# Patient Record
Sex: Male | Born: 1970
Health system: Southern US, Community
[De-identification: ages and names within clinical notes are randomized; demographics above are authoritative.]

## PROBLEM LIST (undated history)

## (undated) DIAGNOSIS — I2510 Atherosclerotic heart disease of native coronary artery without angina pectoris: Secondary | ICD-10-CM

## (undated) DIAGNOSIS — K219 Gastro-esophageal reflux disease without esophagitis: Secondary | ICD-10-CM

## (undated) DIAGNOSIS — E7800 Pure hypercholesterolemia, unspecified: Secondary | ICD-10-CM

## (undated) DIAGNOSIS — G4730 Sleep apnea, unspecified: Secondary | ICD-10-CM

## (undated) DIAGNOSIS — R7303 Prediabetes: Secondary | ICD-10-CM

## (undated) HISTORY — DX: Morbid (severe) obesity due to excess calories: E66.01

## (undated) HISTORY — PX: COLONOSCOPY: SHX174

## (undated) HISTORY — DX: Pure hypercholesterolemia, unspecified: E78.00

## (undated) HISTORY — DX: Gastro-esophageal reflux disease without esophagitis: K21.9

## (undated) HISTORY — DX: Atherosclerotic heart disease of native coronary artery without angina pectoris: I25.10

## (undated) HISTORY — PX: UPPER GASTROINTESTINAL ENDOSCOPY: SHX188

## (undated) HISTORY — DX: Sleep apnea, unspecified: G47.30

---

## 2004-04-23 ENCOUNTER — Ambulatory Visit: Payer: Self-pay | Admitting: Internal Medicine

## 2004-10-14 ENCOUNTER — Ambulatory Visit: Payer: Self-pay | Admitting: Internal Medicine

## 2004-10-16 ENCOUNTER — Ambulatory Visit: Payer: Self-pay

## 2004-11-01 ENCOUNTER — Ambulatory Visit (HOSPITAL_COMMUNITY): Admission: RE | Admit: 2004-11-01 | Discharge: 2004-11-01 | Payer: Self-pay | Admitting: Internal Medicine

## 2004-11-19 ENCOUNTER — Ambulatory Visit: Payer: Self-pay | Admitting: Cardiology

## 2008-06-19 ENCOUNTER — Ambulatory Visit: Payer: Self-pay | Admitting: Family Medicine

## 2008-06-19 DIAGNOSIS — N508 Other specified disorders of male genital organs: Secondary | ICD-10-CM

## 2008-06-21 ENCOUNTER — Encounter: Admission: RE | Admit: 2008-06-21 | Discharge: 2008-06-21 | Payer: Self-pay | Admitting: Family Medicine

## 2008-07-05 ENCOUNTER — Ambulatory Visit: Payer: Self-pay | Admitting: Family Medicine

## 2008-07-05 LAB — CONVERTED CEMR LAB
ALT: 76 units/L — ABNORMAL HIGH (ref 0–53)
AST: 43 units/L — ABNORMAL HIGH (ref 0–37)
Alkaline Phosphatase: 57 units/L (ref 39–117)
Basophils Relative: 0.8 % (ref 0.0–3.0)
Bilirubin, Direct: 0.1 mg/dL (ref 0.0–0.3)
Chloride: 107 meq/L (ref 96–112)
Cholesterol: 230 mg/dL — ABNORMAL HIGH (ref 0–200)
Creatinine, Ser: 1 mg/dL (ref 0.4–1.5)
Eosinophils Relative: 2.2 % (ref 0.0–5.0)
HCT: 45.5 % (ref 39.0–52.0)
Hemoglobin: 16.1 g/dL (ref 13.0–17.0)
Lymphs Abs: 1.9 10*3/uL (ref 0.7–4.0)
MCV: 89.5 fL (ref 78.0–100.0)
Monocytes Absolute: 0.7 10*3/uL (ref 0.1–1.0)
Monocytes Relative: 9.3 % (ref 3.0–12.0)
Neutro Abs: 4.5 10*3/uL (ref 1.4–7.7)
Neutrophils Relative %: 62 % (ref 43.0–77.0)
Platelets: 254 10*3/uL (ref 150.0–400.0)
Potassium: 4.4 meq/L (ref 3.5–5.1)
RBC: 5.08 M/uL (ref 4.22–5.81)
RDW: 12 % (ref 11.5–14.6)
Sodium: 143 meq/L (ref 135–145)
Total CHOL/HDL Ratio: 6
WBC: 7.4 10*3/uL (ref 4.5–10.5)

## 2008-07-20 ENCOUNTER — Ambulatory Visit: Payer: Self-pay | Admitting: Family Medicine

## 2008-07-20 DIAGNOSIS — E785 Hyperlipidemia, unspecified: Secondary | ICD-10-CM | POA: Insufficient documentation

## 2008-09-13 ENCOUNTER — Ambulatory Visit: Payer: Self-pay | Admitting: Family Medicine

## 2008-09-13 LAB — CONVERTED CEMR LAB
Cholesterol: 196 mg/dL (ref 0–200)
HDL: 38 mg/dL — ABNORMAL LOW (ref >39.00)
Total Bilirubin: 0.8 mg/dL (ref 0.3–1.2)
Total CHOL/HDL Ratio: 5
Total Protein: 7.3 g/dL (ref 6.0–8.3)

## 2008-09-20 ENCOUNTER — Ambulatory Visit: Payer: Self-pay | Admitting: Family Medicine

## 2008-12-18 ENCOUNTER — Ambulatory Visit: Payer: Self-pay | Admitting: Family Medicine

## 2008-12-18 DIAGNOSIS — R030 Elevated blood-pressure reading, without diagnosis of hypertension: Secondary | ICD-10-CM

## 2009-01-25 ENCOUNTER — Ambulatory Visit (HOSPITAL_BASED_OUTPATIENT_CLINIC_OR_DEPARTMENT_OTHER): Admission: RE | Admit: 2009-01-25 | Discharge: 2009-01-25 | Payer: Self-pay | Admitting: Urology

## 2009-01-25 ENCOUNTER — Encounter: Payer: Self-pay | Admitting: Urology

## 2009-02-22 ENCOUNTER — Telehealth: Payer: Self-pay | Admitting: Family Medicine

## 2010-02-17 ENCOUNTER — Emergency Department: Payer: Self-pay | Admitting: Emergency Medicine

## 2010-02-25 ENCOUNTER — Ambulatory Visit: Payer: Self-pay | Admitting: Family Medicine

## 2010-02-25 DIAGNOSIS — M549 Dorsalgia, unspecified: Secondary | ICD-10-CM

## 2010-04-07 HISTORY — PX: VASECTOMY: SHX75

## 2010-04-28 ENCOUNTER — Encounter: Payer: Self-pay | Admitting: Internal Medicine

## 2010-05-07 NOTE — Assessment & Plan Note (Signed)
Summary: severe back pain/cjr   Vital Signs:  Patient profile:   40 year old male Height:      73.5 inches Weight:      294 pounds BMI:     38.40 Temp:     98.3 degrees F oral BP sitting:   130 / 88  (left arm) Cuff size:   regular  Vitals Entered By: Kern Reap CMA Duncan Dull) (February 25, 2010 3:40 PM) CC: back pain   CC:  back pain.  History of Present Illness: Brandon Bailey is a 2-year-old male, married, nonsmoker, who comes in today for evaluation of low back pain.  For the past two, weeks he's had low back pain.  10 days ago, became worse, and he went to the emergency room.  They x-rayed his back, which is of course, normal.  He was given prednisone, Vicodin and Flexeril, and comes back and states his back is not any better.  He describes pain is constant, sharp, and 8 on a scale of one to 10.  He points to his right and left lumbar area as a source of his pain.  It does not radiate down his legs.  He has no bowel or bladder dysfunction.  Review of systems otherwise negative.  No previous history of back pain  Allergies: No Known Drug Allergies  Past History:  Past medical, surgical, family and social histories (including risk factors) reviewed for relevance to current acute and chronic problems.  Family History: Reviewed history from 06/19/2008 and no changes required. father died of melanoma mother in good health.  No brothers or sisters  Social History: Reviewed history from 06/19/2008 and no changes required. Occupation: Married Never Smoked Alcohol use-no Drug use-no  Review of Systems      See HPI  Physical Exam  General:  Well-developed,well-nourished,in no acute distress; alert,appropriate and cooperative throughout examination Msk:  extremely tight hamstrings, left and especially on the right. Pulses:  R and L carotid,radial,femoral,dorsalis pedis and posterior tibial pulses are full and equal bilaterally Extremities:  No clubbing, cyanosis, edema, or  deformity noted with normal full range of motion of all joints.   Neurologic:  No cranial nerve deficits noted. Station and gait are normal. Plantar reflexes are down-going bilaterally. DTRs are symmetrical throughout. Sensory, motor and coordinative functions appear intact.   Problems:  Medical Problems Added: 1)  Dx of Back Pain  (ICD-724.5)  Impression & Recommendations:  Problem # 1:  BACK PAIN (ICD-724.5) Assessment New  His updated medication list for this problem includes:    Flexeril 10 Mg Tabs (Cyclobenzaprine hcl) .Marland Kitchen... 1 tab @ bedtime    Vicodin Es 7.5-750 Mg Tabs (Hydrocodone-acetaminophen) .Marland Kitchen... 1 tab @ bedtime  Orders: Physical Therapy Referral (PT)  Complete Medication List: 1)  Simvastatin 40 Mg Tabs (Simvastatin) .Marland Kitchen.. 1 tab @ bedtime 2)  Flexeril 10 Mg Tabs (Cyclobenzaprine hcl) .Marland Kitchen.. 1 tab @ bedtime 3)  Vicodin Es 7.5-750 Mg Tabs (Hydrocodone-acetaminophen) .Marland Kitchen.. 1 tab @ bedtime  Patient Instructions: 1)  begin Motrin 600 mg twice daily with food, and one half, Flexeril, and Vicodin at bedtime. 2)  We will set up physical therapy to begin ASAP. 3)  Return in two weeks for follow-up Prescriptions: VICODIN ES 7.5-750 MG TABS (HYDROCODONE-ACETAMINOPHEN) 1 tab @ bedtime  #30 x 1   Entered and Authorized by:   Roderick Pee MD   Signed by:   Roderick Pee MD on 02/25/2010   Method used:   Print then Give to Patient  RxID:   [id number] FLEXERIL 10 MG TABS (CYCLOBENZAPRINE HCL) 1 tab @ bedtime  #30 x 1   Entered and Authorized by:   Roderick Pee MD   Signed by:   Roderick Pee MD on 02/25/2010   Method used:   Print then Give to Patient   RxID:   [id number]    Orders Added: 1)  Est. Patient Level IV [31540] 2)  Physical Therapy Referral [PT]

## 2010-07-11 LAB — POCT HEMOGLOBIN-HEMACUE: Hemoglobin: 14.9 g/dL (ref 13.0–17.0)

## 2010-12-12 ENCOUNTER — Other Ambulatory Visit: Payer: Self-pay | Admitting: Urology

## 2010-12-12 ENCOUNTER — Ambulatory Visit (HOSPITAL_BASED_OUTPATIENT_CLINIC_OR_DEPARTMENT_OTHER)
Admission: RE | Admit: 2010-12-12 | Discharge: 2010-12-12 | Disposition: A | Discharge status: Home / Self Care | Payer: 59 | Source: Ambulatory Visit | Attending: Urology | Admitting: Urology

## 2010-12-12 DIAGNOSIS — Z01812 Encounter for preprocedural laboratory examination: Secondary | ICD-10-CM | POA: Insufficient documentation

## 2010-12-12 DIAGNOSIS — N4340 Spermatocele of epididymis, unspecified: Secondary | ICD-10-CM | POA: Insufficient documentation

## 2010-12-12 LAB — POCT HEMOGLOBIN-HEMACUE: Hemoglobin: 14.8 g/dL (ref 13.0–17.0)

## 2012-04-21 ENCOUNTER — Encounter: Payer: Self-pay | Admitting: Family Medicine

## 2012-04-21 ENCOUNTER — Ambulatory Visit (INDEPENDENT_AMBULATORY_CARE_PROVIDER_SITE_OTHER): Payer: 59 | Admitting: Family Medicine

## 2012-04-21 VITALS — BP 124/80 | Temp 98.2°F | Ht 74.5 in | Wt 323.0 lb

## 2012-04-21 DIAGNOSIS — E785 Hyperlipidemia, unspecified: Secondary | ICD-10-CM

## 2012-04-21 DIAGNOSIS — E669 Obesity, unspecified: Secondary | ICD-10-CM

## 2012-04-21 LAB — CBC WITH DIFFERENTIAL/PLATELET
Basophils Absolute: 0.1 10*3/uL (ref 0.0–0.1)
Basophils Relative: 0.7 % (ref 0.0–3.0)
Eosinophils Absolute: 0.2 10*3/uL (ref 0.0–0.7)
HCT: 45.6 % (ref 39.0–52.0)
Hemoglobin: 15.4 g/dL (ref 13.0–17.0)
Lymphocytes Relative: 26.9 % (ref 12.0–46.0)
MCHC: 33.8 g/dL (ref 30.0–36.0)
MCV: 87.8 fl (ref 78.0–100.0)
Monocytes Absolute: 0.7 10*3/uL (ref 0.1–1.0)
Monocytes Relative: 8.4 % (ref 3.0–12.0)
Platelets: 275 10*3/uL (ref 150.0–400.0)
WBC: 8.6 10*3/uL (ref 4.5–10.5)

## 2012-04-21 LAB — HEPATIC FUNCTION PANEL
ALT: 64 U/L — ABNORMAL HIGH (ref 0–53)
AST: 41 U/L — ABNORMAL HIGH (ref 0–37)
Alkaline Phosphatase: 61 U/L (ref 39–117)
Bilirubin, Direct: 0.1 mg/dL (ref 0.0–0.3)
Total Bilirubin: 0.9 mg/dL (ref 0.3–1.2)

## 2012-04-21 LAB — BASIC METABOLIC PANEL
BUN: 15 mg/dL (ref 6–23)
Calcium: 9 mg/dL (ref 8.4–10.5)
Chloride: 104 mEq/L (ref 96–112)
GFR: 83.86 mL/min (ref >60.00)
Sodium: 137 mEq/L (ref 135–145)

## 2012-04-21 LAB — POCT URINALYSIS DIPSTICK
Blood, UA: NEGATIVE
Glucose, UA: NEGATIVE
Nitrite, UA: NEGATIVE
Spec Grav, UA: >=1.03

## 2012-04-21 LAB — LDL CHOLESTEROL, DIRECT: Direct LDL: 151.2 mg/dL

## 2012-04-21 LAB — LIPID PANEL
Total CHOL/HDL Ratio: 6
Triglycerides: 169 mg/dL — ABNORMAL HIGH (ref 0.0–149.0)

## 2012-04-21 LAB — HEMOGLOBIN A1C: Hgb A1c MFr Bld: 6 % (ref 4.6–6.5)

## 2012-04-21 NOTE — Progress Notes (Signed)
  Subjective:    Patient ID: Brandon Bailey, male    DOB: 1970-11-20, 42 y.o.   MRN: [id number]  HPI Mayer is a 42 year old married male nonsmoker who comes in today for general physical examination concerned about his weight  His weight is 323 pounds. His wife says he snores and stops previous night and she's concerned he might have sleep apnea. He takes the medication on a daily basis for any chronic health problems  Tetanus booster 2010.  His wife is not overweight he has 2 children ages 13 and 34. Last summer they went to an amusement park but he was not able ride on some of the rise because he was overweight. His daughter has requested that he lose weight   Review of Systems  Constitutional: Negative.   HENT: Negative.   Eyes: Negative.   Respiratory: Negative.   Cardiovascular: Negative.   Gastrointestinal: Negative.   Genitourinary: Negative.   Musculoskeletal: Negative.   Skin: Negative.   Neurological: Negative.   Hematological: Negative.   Psychiatric/Behavioral: Negative.        Objective:   Physical Exam  Constitutional: He is oriented to person, place, and time. He appears well-developed and well-nourished.  HENT:  Head: Normocephalic and atraumatic.  Right Ear: External ear normal.  Left Ear: External ear normal.  Nose: Nose normal.  Mouth/Throat: Oropharynx is clear and moist.  Eyes: Conjunctivae normal and EOM are normal. Pupils are equal, round, and reactive to light.  Neck: Normal range of motion. Neck supple. No JVD present. No tracheal deviation present. No thyromegaly present.  Cardiovascular: Normal rate, regular rhythm, normal heart sounds and intact distal pulses.  Exam reveals no gallop and no friction rub.   No murmur heard. Pulmonary/Chest: Effort normal and breath sounds normal. No stridor. No respiratory distress. He has no wheezes. He has no rales. He exhibits no tenderness.  Abdominal: Soft. Bowel sounds are normal. He exhibits no distension  and no mass. There is no tenderness. There is no rebound and no guarding.  Genitourinary: Penis normal. No penile tenderness.  Musculoskeletal: Normal range of motion. He exhibits no edema and no tenderness.  Lymphadenopathy:    He has no cervical adenopathy.  Neurological: He is alert and oriented to person, place, and time. He has normal reflexes. No cranial nerve deficit. He exhibits normal muscle tone.  Skin: Skin is warm and dry. No rash noted. No erythema. No pallor.  Psychiatric: He has a normal mood and affect. His behavior is normal. Judgment and thought content normal.          Assessment & Plan:  Healthy male  Obesity begin diet exercise and weight loss program check labs  Question sleep apnea consult with Dr. Mellody Dance

## 2012-04-21 NOTE — Patient Instructions (Signed)
I will call you I gets her lab work back  We will set you up a consult with Dr. Mellody Dance for evaluation of possible sleep apnea  Begin a diet and exercise program  Followup with me in 6 months

## 2012-04-26 ENCOUNTER — Encounter: Payer: Self-pay | Admitting: Family Medicine

## 2012-04-27 ENCOUNTER — Telehealth: Payer: Self-pay | Admitting: Family Medicine

## 2012-04-27 NOTE — Telephone Encounter (Signed)
Pt states that his labs were not released to My Chart and is requesting to be called with results or have them mailed to him

## 2012-04-27 NOTE — Telephone Encounter (Signed)
Labs released to MyChart.

## 2012-05-05 ENCOUNTER — Institutional Professional Consult (permissible substitution): Payer: 59 | Admitting: Pulmonary Disease

## 2012-10-19 ENCOUNTER — Ambulatory Visit: Payer: 59 | Admitting: Family Medicine

## 2013-02-10 ENCOUNTER — Other Ambulatory Visit: Payer: Self-pay

## 2013-07-11 ENCOUNTER — Telehealth: Payer: Self-pay | Admitting: Family Medicine

## 2013-07-11 NOTE — Telephone Encounter (Signed)
Left message on machine for patient to return our call.  Patient will need an office visit for a prescription.

## 2013-07-11 NOTE — Telephone Encounter (Signed)
Patient Information:  Caller Name: Sharyn Lull  Phone: [phone]  Patient: Brandon Bailey, Brandon Bailey  Gender: Male  DOB: 1971/02/02  Age: 43 Years  PCP: Stevie Kern [facility])  Office Follow Up:  Does the office need to follow up with this patient?: Yes  Instructions For The Office: Pharmacy is [facility]- Wife is a Engineer, mining. She was diagnosed today with +strep culture.  She is requesting antibiotics for her husband who has fever and Exudate on tonsil.  PLEASE CONTACT.  RN Note:  Pharmacy is [facility]- Wife is a Engineer, mining. She was diagnosed today with +strep culture.  She is requesting antibiotics for her husband who has fever and Exudate on tonsil.  PLEASE CONTACT.  Symptoms  Reason For Call & Symptoms: Wife is calling in about her husband.  she states she was diagnosed with strep throat today. +strep Culture.   She is wanting a Rx for her husband because he has had a sore throat since Saturday 07/09/13.  Exudate noted on left tonsil.  Temperature today 100.8 (o).  Reviewed Health History In EMR: Yes  Reviewed Medications In EMR: Yes  Reviewed Allergies In EMR: Yes  Reviewed Surgeries / Procedures: Yes  Date of Onset of Symptoms: 07/09/2013  Treatments Tried: Ibuprofen  Treatments Tried Worked: No  Any Fever: Yes  Fever Taken: Oral  Fever Time Of Reading: 06:30:00  Fever Last Reading: 100.8  Guideline(s) Used:  Sore Throat  Disposition Per Guideline:   Strep Test Only Visit Today or Tomorrow  Reason For Disposition Reached:   Strep exposure within last 10 days  Advice Given:  For Relief of Sore Throat Pain:  Sip warm chicken broth or apple juice.  Suck on hard candy or a throat lozenge (over-the-counter).  Gargle warm salt water 3 times daily (1 teaspoon of salt in 8 oz or 240 ml of warm water).  Avoid cigarette smoke.  Pain Medicines:  For pain relief, you can take either acetaminophen, ibuprofen, or naproxen.  They are over-the-counter  (OTC) pain drugs. You can buy them at the drugstore.  Acetaminophen (e.g., Tylenol):  Regular Strength Tylenol: Take 650 mg (two 325 mg pills) by mouth every 4-6 hours as needed. Each Regular Strength Tylenol pill has 325 mg of acetaminophen.  Ibuprofen (e.g., Motrin, Advil):  Take 400 mg (two 200 mg pills) by mouth every 6 hours.  Liquids:  Adequate liquid intake is important to prevent dehydration. Drink 6-8 glasses of water per day.  Soft Diet:   Cold drinks and milk shakes are especially good (Reason: swollen tonsils can make some foods hard to swallow).  Contagiousness:   You can return to work or school after the fever is gone and you feel well enough to participate in normal activities. If your doctor determines that you have Strep throat, then you will need to take an antibiotic for 24 hours before you can return.  Call Back If:  Fever lasts longer than 3 days  You become worse.  Patient Refused Recommendation:  Patient Requests Prescription  Pharmacy is [facility]- Wife is a Engineer, mining. She was diagnosed today with +strep culture.  She is requesting antibiotics for her husband who has fever and Exudate on tonsil.  PLEASE CONTACT.

## 2013-07-12 NOTE — Telephone Encounter (Signed)
LMTCB

## 2013-08-13 ENCOUNTER — Encounter: Payer: Self-pay | Admitting: Family Medicine

## 2013-08-13 ENCOUNTER — Ambulatory Visit (INDEPENDENT_AMBULATORY_CARE_PROVIDER_SITE_OTHER): Payer: 59 | Admitting: Family Medicine

## 2013-08-13 VITALS — BP 128/80 | HR 99 | Temp 99.0°F | Wt 332.0 lb

## 2013-08-13 DIAGNOSIS — H6690 Otitis media, unspecified, unspecified ear: Secondary | ICD-10-CM

## 2013-08-13 DIAGNOSIS — H6692 Otitis media, unspecified, left ear: Secondary | ICD-10-CM

## 2013-08-13 MED ORDER — MOXIFLOXACIN HCL 400 MG PO TABS: 400.0000 mg | ORAL_TABLET | Freq: Every day | ORAL | Status: DC

## 2013-08-13 NOTE — Progress Notes (Signed)
   Subjective:    Patient ID: Brandon Bailey, male    DOB: [birth year], 43 y.o.   MRN: [id number]  Cough This is a new problem. The current episode started 1 to 4 weeks ago ( seen 5 days ago at urgent care.. given augmentin for left ear  otitis media). The problem has been gradually worsening. The problem occurs constantly. The cough is productive of purulent sputum. Associated symptoms include ear congestion and nasal congestion. Pertinent negatives include no ear pain, shortness of breath or wheezing. Associated symptoms comments: Pain in ear has improved, but still full. Risk factors: non smoker. Treatments tried: guafenesin codeine, augementin. The treatment provided significant relief. There is no history of asthma, COPD or environmental allergies.      Review of Systems  HENT: Negative for ear pain.   Respiratory: Positive for cough. Negative for shortness of breath and wheezing.   Allergic/Immunologic: Negative for environmental allergies.       Objective:   Physical Exam  Constitutional: Vital signs are normal. He appears well-developed and well-nourished.  Non-toxic appearance. He does not appear ill. No distress.  HENT:  Head: Normocephalic and atraumatic.  Right Ear: Hearing, tympanic membrane, external ear and ear canal normal. No tenderness. No foreign bodies. Tympanic membrane is not retracted and not bulging.  Left Ear: Hearing, external ear and ear canal normal. No tenderness. No foreign bodies. Tympanic membrane is injected, scarred, erythematous and retracted. Tympanic membrane is not bulging. A middle ear effusion is present.  Nose: Nose normal. No mucosal edema or rhinorrhea. Right sinus exhibits no maxillary sinus tenderness and no frontal sinus tenderness. Left sinus exhibits no maxillary sinus tenderness and no frontal sinus tenderness.  Mouth/Throat: Uvula is midline, oropharynx is clear and moist and mucous membranes are normal. Normal dentition. No dental caries. No  oropharyngeal exudate or tonsillar abscesses.  Eyes: Conjunctivae, EOM and lids are normal. Pupils are equal, round, and reactive to light. Lids are everted and swept, no foreign bodies found.  Neck: Trachea normal, normal range of motion and phonation normal. Neck supple. Carotid bruit is not present. No mass and no thyromegaly present.  Cardiovascular: Normal rate, regular rhythm, S1 normal, S2 normal, normal heart sounds, intact distal pulses and normal pulses.  Exam reveals no gallop.   No murmur heard. Pulmonary/Chest: Effort normal and breath sounds normal. No respiratory distress. He has no wheezes. He has no rhonchi. He has no rales.  Abdominal: Soft. Normal appearance and bowel sounds are normal. There is no hepatosplenomegaly. There is no tenderness. There is no rebound, no guarding and no CVA tenderness. No hernia.  Neurological: He is alert. He has normal reflexes.  Skin: Skin is warm, dry and intact. No rash noted.  Psychiatric: He has a normal mood and affect. His speech is normal and behavior is normal. Judgment normal.          Assessment & Plan:

## 2013-08-13 NOTE — Progress Notes (Signed)
Pre visit review using our clinic review tool, if applicable. No additional management support is needed unless otherwise documented below in the visit note. 

## 2013-08-13 NOTE — Patient Instructions (Signed)
Stop augemntin. Change to avelox.  Mucinex DM during the day. Start nasal saline spray 3-4 times day.

## 2013-08-15 DIAGNOSIS — H6692 Otitis media, unspecified, left ear: Secondary | ICD-10-CM | POA: Insufficient documentation

## 2013-08-15 NOTE — Assessment & Plan Note (Signed)
D/C augmentin given minimal improvement. Will change to flouroquinolone.  Continue symptomatic care.

## 2014-06-07 ENCOUNTER — Other Ambulatory Visit (INDEPENDENT_AMBULATORY_CARE_PROVIDER_SITE_OTHER): Payer: 59

## 2014-06-07 DIAGNOSIS — Z0000 Encounter for general adult medical examination without abnormal findings: Secondary | ICD-10-CM

## 2014-06-07 LAB — CBC WITH DIFFERENTIAL/PLATELET
BASOS PCT: 0.4 % (ref 0.0–3.0)
Basophils Absolute: 0 10*3/uL (ref 0.0–0.1)
EOS ABS: 0.3 10*3/uL (ref 0.0–0.7)
EOS PCT: 2.9 % (ref 0.0–5.0)
HCT: 45.9 % (ref 39.0–52.0)
Hemoglobin: 15.9 g/dL (ref 13.0–17.0)
LYMPHS PCT: 23.4 % (ref 12.0–46.0)
Lymphs Abs: 2.1 10*3/uL (ref 0.7–4.0)
MCHC: 34.6 g/dL (ref 30.0–36.0)
MCV: 85.9 fl (ref 78.0–100.0)
Monocytes Absolute: 0.8 10*3/uL (ref 0.1–1.0)
Monocytes Relative: 8.7 % (ref 3.0–12.0)
NEUTROS PCT: 64.6 % (ref 43.0–77.0)
Neutro Abs: 5.9 10*3/uL (ref 1.4–7.7)
Platelets: 287 10*3/uL (ref 150.0–400.0)
RBC: 5.34 Mil/uL (ref 4.22–5.81)
RDW: 13.4 % (ref 11.5–15.5)
WBC: 9.1 10*3/uL (ref 4.0–10.5)

## 2014-06-07 LAB — POCT URINALYSIS DIPSTICK
BILIRUBIN UA: NEGATIVE
Blood, UA: NEGATIVE
Glucose, UA: NEGATIVE
KETONES UA: NEGATIVE
LEUKOCYTES UA: NEGATIVE
Nitrite, UA: NEGATIVE
PH UA: 5.5
PROTEIN UA: NEGATIVE
SPEC GRAV UA: 1.02
Urobilinogen, UA: 0.2

## 2014-06-07 LAB — LIPID PANEL
CHOLESTEROL: 230 mg/dL — AB (ref 0–200)
HDL: 40.1 mg/dL (ref >39.00)
LDL CALC: 155 mg/dL — AB (ref 0–99)
NonHDL: 189.9
TRIGLYCERIDES: 173 mg/dL — AB (ref 0.0–149.0)
Total CHOL/HDL Ratio: 6
VLDL: 34.6 mg/dL (ref 0.0–40.0)

## 2014-06-07 LAB — BASIC METABOLIC PANEL
BUN: 16 mg/dL (ref 6–23)
CHLORIDE: 102 meq/L (ref 96–112)
CO2: 26 mEq/L (ref 19–32)
Calcium: 9.3 mg/dL (ref 8.4–10.5)
Creatinine, Ser: 1.04 mg/dL (ref 0.40–1.50)
GFR: 82.12 mL/min (ref >60.00)
GLUCOSE: 114 mg/dL — AB (ref 70–99)
Potassium: 3.7 mEq/L (ref 3.5–5.1)
Sodium: 136 mEq/L (ref 135–145)

## 2014-06-07 LAB — HEPATIC FUNCTION PANEL
ALBUMIN: 4.2 g/dL (ref 3.5–5.2)
ALT: 47 U/L (ref 0–53)
AST: 33 U/L (ref 0–37)
Alkaline Phosphatase: 75 U/L (ref 39–117)
Bilirubin, Direct: 0.1 mg/dL (ref 0.0–0.3)
TOTAL PROTEIN: 7.9 g/dL (ref 6.0–8.3)
Total Bilirubin: 0.7 mg/dL (ref 0.2–1.2)

## 2014-06-07 LAB — TSH: TSH: 2.18 u[IU]/mL (ref 0.35–4.50)

## 2014-06-14 ENCOUNTER — Telehealth: Payer: Self-pay | Admitting: Pulmonary Disease

## 2014-06-14 ENCOUNTER — Encounter: Payer: Self-pay | Admitting: *Deleted

## 2014-06-14 ENCOUNTER — Ambulatory Visit (INDEPENDENT_AMBULATORY_CARE_PROVIDER_SITE_OTHER): Payer: 59 | Admitting: Family Medicine

## 2014-06-14 ENCOUNTER — Encounter: Payer: Self-pay | Admitting: Family Medicine

## 2014-06-14 VITALS — BP 120/84 | Temp 98.4°F | Ht 74.25 in | Wt 353.0 lb

## 2014-06-14 DIAGNOSIS — Z0000 Encounter for general adult medical examination without abnormal findings: Secondary | ICD-10-CM

## 2014-06-14 DIAGNOSIS — E669 Obesity, unspecified: Secondary | ICD-10-CM

## 2014-06-14 NOTE — Progress Notes (Signed)
Pre visit review using our clinic review tool, if applicable. No additional management support is needed unless otherwise documented below in the visit note. 

## 2014-06-14 NOTE — Telephone Encounter (Signed)
Spoke with Brandon Bailey at Dr. Honor Junes office, advised her that there are only 4 sleep docs in the office and the end of April is the earliest appt available.  Nothing further needed at this time.

## 2014-06-14 NOTE — Patient Instructions (Addendum)
We will set up a consult with Dr. Bonner Puna....... in pulmonary for evaluation of the sleep apnea  We will set up a nutrition consult  You will follow-up with Dr. Hassell Done  No tea no sodas no sugar  Begin walking 15 minutes daily in 1 week increase it to 30 minutes daily

## 2014-06-14 NOTE — Progress Notes (Signed)
   Subjective:    Patient ID: Brandon Bailey, male    DOB: 06/13/1970, 44 y.o.   MRN: [id number]  HPI Valerian is a 44 year old married male nonsmoker who comes in today for general physical examination because of morbid obesity.  His weight is up to 353 pounds. We saw him in 2004. We prescribed a diet and exercise program consult with Lanny Hurst clients in pulmonary because of sleep apnea. He never came back for follow-up. He now is considering it. He also was considering bariatric surgery by Dr. Hassell Done  Vaccinations up-to-date  Father had glaucoma recommended annual eye exam  He's tried numerous weight loss programs including Weight Watchers etc. etc. with no long-term weight loss achieved. Therefore I think he would be an excellent candidate for the bariatric program with Dr. Hassell Done. He's now highly motivated. He has glucose intolerance and elevated cholesterol blood pressure is normal for now. He also has sleep apnea. His wife tells me he has to get up 45 times a night the P he drinks copious amounts of Tea    Review of Systems  Constitutional: Negative.   HENT: Negative.   Eyes: Negative.   Respiratory: Negative.   Cardiovascular: Negative.   Gastrointestinal: Negative.   Endocrine: Negative.   Genitourinary: Negative.   Musculoskeletal: Negative.   Skin: Negative.   Allergic/Immunologic: Negative.   Neurological: Negative.   Hematological: Negative.   Psychiatric/Behavioral: Negative.        Objective:   Physical Exam  Constitutional: He is oriented to person, place, and time. He appears well-developed and well-nourished.  HENT:  Head: Normocephalic and atraumatic.  Right Ear: External ear normal.  Left Ear: External ear normal.  Nose: Nose normal.  Mouth/Throat: Oropharynx is clear and moist.  Eyes: Conjunctivae and EOM are normal. Pupils are equal, round, and reactive to light.  Neck: Normal range of motion. Neck supple. No JVD present. No tracheal deviation present. No  thyromegaly present.  Cardiovascular: Normal rate, regular rhythm, normal heart sounds and intact distal pulses.  Exam reveals no gallop and no friction rub.   No murmur heard. Pulmonary/Chest: Effort normal and breath sounds normal. No stridor. No respiratory distress. He has no wheezes. He has no rales. He exhibits no tenderness.  Abdominal: Soft. Bowel sounds are normal. He exhibits no distension and no mass. There is no tenderness. There is no rebound and no guarding.  Genitourinary:  No history of any genitorectal problems no family history of prostate cancer therefore screening at age 2  Musculoskeletal: Normal range of motion. He exhibits no edema or tenderness.  Lymphadenopathy:    He has no cervical adenopathy.  Neurological: He is alert and oriented to person, place, and time. He has normal reflexes. No cranial nerve deficit. He exhibits normal muscle tone.  Skin: Skin is warm and dry. No rash noted. No erythema. No pallor.  Psychiatric: He has a normal mood and affect. His behavior is normal. Judgment and thought content normal.  Nursing note and vitals reviewed.         Assessment & Plan:  Morbid obesity...Marland KitchenMarland KitchenMarland Kitchen outlined program of diet exercise weight loss nutrition consult consult with Dr. Kaylyn Lim.....Marland Kitchen also with [facility] Hurst C because of the sleep apnea

## 2014-07-06 ENCOUNTER — Telehealth: Payer: Self-pay | Admitting: Family Medicine

## 2014-07-06 NOTE — Telephone Encounter (Signed)
Spoke with patient and he will fax papers to be filled out and re-faxed to [phone].

## 2014-07-06 NOTE — Telephone Encounter (Signed)
Pt would like a call back he has some paperwork that need to be filled out before April 6

## 2014-07-07 ENCOUNTER — Encounter: Payer: Self-pay | Admitting: *Deleted

## 2014-07-07 DIAGNOSIS — R7303 Prediabetes: Secondary | ICD-10-CM | POA: Insufficient documentation

## 2014-07-07 DIAGNOSIS — G4733 Obstructive sleep apnea (adult) (pediatric): Secondary | ICD-10-CM | POA: Insufficient documentation

## 2014-07-12 ENCOUNTER — Encounter: Payer: 59 | Attending: Family Medicine | Admitting: Dietician

## 2014-07-12 ENCOUNTER — Other Ambulatory Visit (INDEPENDENT_AMBULATORY_CARE_PROVIDER_SITE_OTHER): Payer: Self-pay

## 2014-07-12 VITALS — Ht 74.0 in | Wt 347.4 lb

## 2014-07-12 DIAGNOSIS — Z01818 Encounter for other preprocedural examination: Secondary | ICD-10-CM

## 2014-07-12 DIAGNOSIS — E669 Obesity, unspecified: Secondary | ICD-10-CM

## 2014-07-12 DIAGNOSIS — R7309 Other abnormal glucose: Secondary | ICD-10-CM | POA: Insufficient documentation

## 2014-07-12 DIAGNOSIS — Z6841 Body Mass Index (BMI) 40.0 and over, adult: Secondary | ICD-10-CM | POA: Insufficient documentation

## 2014-07-12 DIAGNOSIS — E785 Hyperlipidemia, unspecified: Secondary | ICD-10-CM | POA: Diagnosis not present

## 2014-07-12 DIAGNOSIS — Z713 Dietary counseling and surveillance: Secondary | ICD-10-CM | POA: Diagnosis not present

## 2014-07-12 DIAGNOSIS — M549 Dorsalgia, unspecified: Secondary | ICD-10-CM | POA: Diagnosis not present

## 2014-07-12 DIAGNOSIS — G4730 Sleep apnea, unspecified: Secondary | ICD-10-CM | POA: Insufficient documentation

## 2014-07-12 NOTE — Progress Notes (Signed)
  Pre-Op Assessment Visit:  Pre-Operative Gastric sleeve Surgery  Medical Nutrition Therapy:  Appt start time: 1100   End time:  1130.  Patient was seen on 07/12/14 for Pre-Operative Nutrition Assessment. Assessment and letter of approval faxed to [facility] Bariatric Surgery Program coordinator on 07/12/14.   Preferred Learning Style:   No preference indicated   Learning Readiness:   Ready  Handouts given during visit include:  Pre-Op Goals Bariatric Surgery Protein Shakes   During the appointment today the following Pre-Op Goals were reviewed with the patient: Maintain or lose weight as instructed by your surgeon Make healthy food choices Begin to limit portion sizes Limited concentrated sugars and fried foods Keep fat/sugar in the single digits per serving on   food labels Practice CHEWING your food  (aim for 30 chews per bite or until applesauce consistency) Practice not drinking 15 minutes before, during, and 30 minutes after each meal/snack Avoid all carbonated beverages  Avoid/limit caffeinated beverages  Avoid all sugar-sweetened beverages Consume 3 meals per day; eat every 3-5 hours Make a list of non-food related activities Aim for 64-100 ounces of FLUID daily  Aim for at least 60-80 grams of PROTEIN daily Look for a liquid protein source that contain ?15 g protein and ?5 g carbohydrate  (ex: shakes, drinks, shots)  Patient-Centered Goals: -Overall healthy lifestyle -More active with kids -Rides at [facility] with daughter  Scale of 1-10: confidence (9) / importance (10)  Demonstrated degree of understanding via:  Teach Back  Teaching Method Utilized:  Visual Auditory Hands on  Barriers to learning/adherence to lifestyle change: none  Patient to call the [facility] to enroll in Pre-Op and Post-Op Nutrition Education when surgery date is scheduled.

## 2014-08-04 ENCOUNTER — Encounter: Payer: Self-pay | Admitting: Pulmonary Disease

## 2014-08-04 ENCOUNTER — Ambulatory Visit (INDEPENDENT_AMBULATORY_CARE_PROVIDER_SITE_OTHER): Payer: 59 | Admitting: Pulmonary Disease

## 2014-08-04 DIAGNOSIS — G4733 Obstructive sleep apnea (adult) (pediatric): Secondary | ICD-10-CM | POA: Diagnosis not present

## 2014-08-04 NOTE — Patient Instructions (Signed)
Will set up for home sleep testing, and will call once the results are available. Work on weight loss  

## 2014-08-04 NOTE — Progress Notes (Signed)
Subjective:    Patient ID: Brandon Bailey, male    DOB: 1970/09/04, 44 y.o.   MRN: [id number]  HPI The patient is a 44 year old male who I've been asked to see for possible obstructive sleep apnea. He has been told by his wife that he is a loud snorer, and also has an abnormal breathing pattern during sleep. He has frequent awakenings at night, and is not rested in the mornings upon arising. He notes significant inappropriate daytime sleepiness while at work, and he can even fall asleep at his desk. He also dozes in the evenings watching television or movies, and can experience sleep pressure while driving longer distances. The patient states that his weight is up 25 pounds over the last 2 years, and his Epworth score is very abnormal at 14.   Sleep Questionnaire What time do you typically go to bed?( Between what hours) 9-10p 9-10p at 1400 on 08/04/14 by Inge Rise, CMA How long does it take you to fall asleep? less than 10 min less than 10 min at 1400 on 08/04/14 by Inge Rise, CMA How many times during the night do you wake up? 4 4 at 1400 on 08/04/14 by Inge Rise, [place] What time do you get out of bed to start your day? 0600 0600 at 1400 on 08/04/14 by Inge Rise, CMA Do you drive or operate heavy machinery in your occupation? No No at 1400 on 08/04/14 by Inge Rise, CMA How much has your weight changed (up or down) over the past two years? (In pounds) 25 lb (11.34 kg) 25 lb (11.34 kg) at 1400 on 08/04/14 by Inge Rise, CMA Have you ever had a sleep study before? No No at 1400 on 08/04/14 by Inge Rise, CMA Do you currently use CPAP? No No at 1400 on 08/04/14 by Inge Rise, CMA Do you wear oxygen at any time? No No at 1400 on 08/04/14 by Inge Rise, CMA   Review of Systems  Constitutional: Negative for fever and unexpected weight change.  HENT: Negative for congestion, dental problem, ear pain, nosebleeds, postnasal drip, rhinorrhea, sinus  pressure, sneezing, sore throat and trouble swallowing.   Eyes: Negative for redness and itching.  Respiratory: Positive for shortness of breath. Negative for cough, chest tightness and wheezing.   Cardiovascular: Positive for leg swelling. Negative for palpitations.  Gastrointestinal: Negative for nausea and vomiting.  Genitourinary: Negative for dysuria.  Musculoskeletal: Positive for arthralgias. Negative for joint swelling.  Skin: Negative for rash.  Neurological: Negative for headaches.  Hematological: Does not bruise/bleed easily.  Psychiatric/Behavioral: Negative for dysphoric mood. The patient is not nervous/anxious.        Objective:   Physical Exam Constitutional:  Obese male, no acute distress  HENT:  Nares patent without discharge, deviated septum to right with narrowing.   Oropharynx without exudate, palate and uvula are thick and elongated.   Eyes:  Perrla, eomi, no scleral icterus  Neck:  No JVD, no TMG  Cardiovascular:  Normal rate, regular rhythm, no rubs or gallops.  No murmurs        Intact distal pulses  Pulmonary :  Normal breath sounds, no stridor or respiratory distress   No rales, rhonchi, or wheezing  Abdominal:  Soft, nondistended, bowel sounds present.  No tenderness noted.   Musculoskeletal:  No lower extremity edema noted.  Lymph Nodes:  No cervical lymphadenopathy noted  Skin:  No cyanosis noted  Neurologic:  Alert, appropriate,  moves all 4 extremities without obvious deficit.         Assessment & Plan:

## 2014-08-04 NOTE — Assessment & Plan Note (Signed)
The patient's history is classic for significant sleep disordered breathing. I have had a long discussion with him about the pathophysiology of sleep apnea, including its impact to his quality of life and cardiovascular health. I think he needs a sleep study at this time for diagnosis, and he is agreeable to this approach. He is an excellent candidate for home sleep testing.

## 2014-08-09 ENCOUNTER — Ambulatory Visit (HOSPITAL_COMMUNITY)
Admission: RE | Admit: 2014-08-09 | Discharge: 2014-08-09 | Disposition: A | Discharge status: Home / Self Care | Payer: 59 | Source: Ambulatory Visit | Attending: Surgery | Admitting: Surgery

## 2014-08-09 ENCOUNTER — Other Ambulatory Visit (INDEPENDENT_AMBULATORY_CARE_PROVIDER_SITE_OTHER): Payer: Self-pay

## 2014-08-09 ENCOUNTER — Other Ambulatory Visit: Payer: Self-pay

## 2014-08-09 ENCOUNTER — Ambulatory Visit (HOSPITAL_COMMUNITY)
Admission: RE | Admit: 2014-08-09 | Discharge: 2014-08-09 | Disposition: A | Discharge status: Home / Self Care | Payer: 59 | Source: Ambulatory Visit | Attending: General Surgery | Admitting: General Surgery

## 2014-08-09 ENCOUNTER — Encounter (HOSPITAL_COMMUNITY)
Admission: RE | Disposition: A | Discharge status: Home / Self Care | Payer: Self-pay | Source: Ambulatory Visit | Attending: Surgery

## 2014-08-09 DIAGNOSIS — Z01818 Encounter for other preprocedural examination: Secondary | ICD-10-CM

## 2014-08-09 HISTORY — PX: BREATH TEK H PYLORI: SHX5422

## 2014-08-09 SURGERY — BREATH TEST, FOR HELICOBACTER PYLORI

## 2014-08-09 NOTE — Progress Notes (Signed)
   08/09/14 [place]  Referring MD Dr Johnathan Hausen  Time of Last PO Intake 1800  Baseline Breath At: 2951  Pranactin Given At: 8841  Post-Dose Breath At: 0805  Sample 1 2.0%  Sample 2 2.2&  Test Negative

## 2014-08-10 ENCOUNTER — Encounter (HOSPITAL_COMMUNITY): Payer: Self-pay | Admitting: Surgery

## 2014-08-16 ENCOUNTER — Encounter: Payer: 59 | Attending: Family Medicine | Admitting: Dietician

## 2014-08-16 VITALS — Ht 74.0 in | Wt 340.4 lb

## 2014-08-16 DIAGNOSIS — M549 Dorsalgia, unspecified: Secondary | ICD-10-CM | POA: Diagnosis not present

## 2014-08-16 DIAGNOSIS — E785 Hyperlipidemia, unspecified: Secondary | ICD-10-CM | POA: Diagnosis not present

## 2014-08-16 DIAGNOSIS — Z6841 Body Mass Index (BMI) 40.0 and over, adult: Secondary | ICD-10-CM | POA: Insufficient documentation

## 2014-08-16 DIAGNOSIS — G4730 Sleep apnea, unspecified: Secondary | ICD-10-CM | POA: Diagnosis not present

## 2014-08-16 DIAGNOSIS — R7309 Other abnormal glucose: Secondary | ICD-10-CM | POA: Diagnosis not present

## 2014-08-16 DIAGNOSIS — Z01818 Encounter for other preprocedural examination: Secondary | ICD-10-CM | POA: Diagnosis present

## 2014-08-16 DIAGNOSIS — Z713 Dietary counseling and surveillance: Secondary | ICD-10-CM | POA: Insufficient documentation

## 2014-08-16 DIAGNOSIS — E669 Obesity, unspecified: Secondary | ICD-10-CM

## 2014-08-16 NOTE — Progress Notes (Signed)
Supervised Weight Loss:  Appt start time: 0800 end time:  0815  SWL visit 3 of 7:  Primary concerns today: Brandon Bailey returns for supervised weight loss having lost 5 pounds in the last month. He is having trouble walking consistently. Tried a powder protein shake and likes the vanilla flavor with unsweetened almond milk. Trying to drink more water.   Goal: Work on chewing thoroughly, taking small bites, and eating slowly  Weight: 340.4 lbs BMI: 43.8  Patient-Centered Goals: -Overall healthy lifestyle -More active with kids -Rides at [facility] with daughter  Scale of 1-10: confidence (9) / importance (10)  MEDICATIONS: see list  DIETARY INTAKE:  24-hr recall:  B ( AM): 2 eggs and 2 pieces of [place] bacon  Snk ( AM) :protein shake  L ( PM): chicken or hamburger steak with steamed vegetables   Snk ( PM): apple with peanut butter D ( PM): steak and sweet potato, green vegetable, pinto beans  Snk ( PM):   Beverages: water  Recent physical activity: trying to walk 30 minutes a day (3x a week usually)   Estimated energy needs: 2000 calories  Progress Towards Goal(s):  In progress.   Nutritional Diagnosis:  [place]-3.3 Overweight/obesity related to past poor dietary habits and physical inactivity as evidenced by patient in SWL for pending bariatric surgery following dietary guidelines for continued weight loss.     Intervention:  Nutrition counseling provided.   Monitoring/Evaluation:  Dietary intake, exercise, and body weight in 4 week(s).

## 2014-09-11 ENCOUNTER — Telehealth: Payer: Self-pay | Admitting: Pulmonary Disease

## 2014-09-12 NOTE — Telephone Encounter (Signed)
Called and spoke to pt's wife, Sharyn Lull. Sharyn Lull stated they have not yet heard from [place] regarding pt's home sleep study. Sharyn Lull is requesting the study to be done as soon as possible as pt is now having longer periods of apnea during sleep.   PCC's please advise.

## 2014-09-13 ENCOUNTER — Encounter: Payer: 59 | Attending: Family Medicine | Admitting: Dietician

## 2014-09-13 VITALS — Ht 74.0 in | Wt 341.9 lb

## 2014-09-13 DIAGNOSIS — Z01818 Encounter for other preprocedural examination: Secondary | ICD-10-CM | POA: Diagnosis present

## 2014-09-13 DIAGNOSIS — Z713 Dietary counseling and surveillance: Secondary | ICD-10-CM | POA: Insufficient documentation

## 2014-09-13 DIAGNOSIS — E669 Obesity, unspecified: Secondary | ICD-10-CM

## 2014-09-13 DIAGNOSIS — M549 Dorsalgia, unspecified: Secondary | ICD-10-CM | POA: Insufficient documentation

## 2014-09-13 DIAGNOSIS — E785 Hyperlipidemia, unspecified: Secondary | ICD-10-CM | POA: Insufficient documentation

## 2014-09-13 DIAGNOSIS — Z6841 Body Mass Index (BMI) 40.0 and over, adult: Secondary | ICD-10-CM | POA: Insufficient documentation

## 2014-09-13 DIAGNOSIS — R7309 Other abnormal glucose: Secondary | ICD-10-CM | POA: Insufficient documentation

## 2014-09-13 DIAGNOSIS — G4730 Sleep apnea, unspecified: Secondary | ICD-10-CM | POA: Diagnosis not present

## 2014-09-13 NOTE — Progress Notes (Signed)
Supervised Weight Loss:  Appt start time: 0800 end time:  0815  SWL visit 4 of 7:  Primary concerns today: Brandon Bailey returns for supervised weight loss having gained 1.5 pounds in the last month. Was on vacation 2 weeks ago and tried to focus more on protein foods. Also got more walking in. He has been working on eating slowly, taking small bites, and eating slowly. Still working on drinking more water.  Goal:  -Work on chewing thoroughly, taking small bites, and eating slowly -Aim to take 3 walks per week  Weight: 341.9 lbs BMI: 44  Patient-Centered Goals: -Overall healthy lifestyle -More active with kids -Rides at [facility] with daughter  Scale of 1-10: confidence (9) / importance (10)  MEDICATIONS: see list  DIETARY INTAKE:  24-hr recall:  B ( AM): 2 eggs and 2 pieces of [place] bacon  Snk ( AM) :protein shake  L ( PM): chicken or hamburger steak with steamed vegetables   Snk ( PM): apple with peanut butter D ( PM): steak and sweet potato, green vegetable, pinto beans  Snk ( PM):   Beverages: water  Recent physical activity: trying to walk 30 minutes a day (3x a week usually)   Estimated energy needs: 2000 calories  Progress Towards Goal(s):  In progress.   Nutritional Diagnosis:  [place]-3.3 Overweight/obesity related to past poor dietary habits and physical inactivity as evidenced by patient in SWL for pending bariatric surgery following dietary guidelines for continued weight loss.     Intervention:  Nutrition counseling provided.   Monitoring/Evaluation:  Dietary intake, exercise, and body weight in 4 week(s).

## 2014-09-13 NOTE — Telephone Encounter (Signed)
[facility] - there is a future order in the system for HST that was entered on 08/04/14 and does not expire until 08/04/15.  Do you still need a new order?  Or can you go with this order?

## 2014-09-13 NOTE — Telephone Encounter (Signed)
No new order thanks we will take care of it asap Brandon Bailey

## 2014-09-13 NOTE — Telephone Encounter (Signed)
Called and spoke with pt's wife and pt is scheduled to p/u device tomorrow (Thurs. 09/14/14) between 2:30 - 4:30. Nothing else needed at this time.  No new order needed. Rhonda J Cobb

## 2014-09-13 NOTE — Telephone Encounter (Signed)
i have searched and i do not see an order for HST [facility] seen pt 08/04/14 but i don't see order please place one and we will do asap Brandon Bailey

## 2014-09-14 DIAGNOSIS — G4730 Sleep apnea, unspecified: Secondary | ICD-10-CM | POA: Diagnosis not present

## 2014-09-16 ENCOUNTER — Telehealth: Payer: Self-pay | Admitting: Pulmonary Disease

## 2014-09-16 DIAGNOSIS — G4730 Sleep apnea, unspecified: Secondary | ICD-10-CM

## 2014-09-16 NOTE — Telephone Encounter (Signed)
Mindy, let pt know that he has severe sleep apnea, and needs ov to review study and discuss treatment options. Thanks.

## 2014-09-18 NOTE — Telephone Encounter (Signed)
lmtcb x1 for pt. 

## 2014-09-18 NOTE — Telephone Encounter (Signed)
Called spoke with spouse. appt scheduled to see TP 09/29/14. Will make TP aware.

## 2014-09-18 NOTE — Telephone Encounter (Signed)
Dr. Halford Chessman has nothing available until august. Can this patient be worked in? Please advise thanks

## 2014-09-18 NOTE — Telephone Encounter (Signed)
Pt returning call.Brandon Bailey ° °

## 2014-09-18 NOTE — Telephone Encounter (Signed)
Can schedule ROV with Tammy Parrett if I don't have anything available, and then he can f/u with me after that in few months after starting therapy for sleep apnea.

## 2014-09-19 ENCOUNTER — Encounter: Payer: Self-pay | Admitting: Pulmonary Disease

## 2014-09-29 ENCOUNTER — Ambulatory Visit (INDEPENDENT_AMBULATORY_CARE_PROVIDER_SITE_OTHER): Payer: 59 | Admitting: Adult Health

## 2014-09-29 ENCOUNTER — Encounter: Payer: Self-pay | Admitting: Adult Health

## 2014-09-29 VITALS — BP 124/78 | HR 102 | Temp 98.1°F | Ht 74.0 in | Wt 350.0 lb

## 2014-09-29 DIAGNOSIS — E669 Obesity, unspecified: Secondary | ICD-10-CM | POA: Diagnosis not present

## 2014-09-29 DIAGNOSIS — G4733 Obstructive sleep apnea (adult) (pediatric): Secondary | ICD-10-CM

## 2014-09-29 NOTE — Assessment & Plan Note (Signed)
Encouraged on weight loss

## 2014-09-29 NOTE — Patient Instructions (Signed)
Begin CPAP At bedtime  .  Auto-titration 5-15 with full fask mask.  Download in 4 weeks . Marland Kitchen  Weight loss.  follow up Dr. Elsworth Soho  In 6 weeks .

## 2014-09-29 NOTE — Progress Notes (Signed)
Subjective:    Patient ID: Brandon Bailey, male    DOB: April 07, 1971, 44 y.o.   MRN: [id number]  HPI 44 year old male seen for sleep consult 08/04/2014 with Dr. Gwenette Greet.  Test Home sleep study> AHI 80 with oxygen desaturation as low as 53%  09/29/2014 follow-up test results Patient returns a copy by his wife who is a Designer, jewellery. Patient was seen 2 months ago for sleep consult for daytime sleepiness, snoring and witnessed sleep apneas . Patient underwent a home sleep study that showed severe sleep apnea with a AHI of 80, low O2 saturation of 53%. He had a combination of obstructive and central sleep apnea. We discussed treatment options with C Pap. He is aware agreement and would like to proceed. We discussed C Pap titration. However, the sleep lab is booked out a few weeks. We will proceed with a C Pap auto titration at home. We discussed weight loss. Patient has been evaluated by general surgery for consideration of weight loss surgery. He is planning on the gastric sleeve later on this year. Patient education was given on sleep apnea and potential for development of comorbidities. -Including difficult to control hypertension, atrial fibrillation, pulmonary hypertension, etc. Advised on not driving if sleepy. Wife is concerned, the patient has had lower extremity swelling. He has been seen by his primary care physician for this recommended on leg elevation, low-salt diet. We did discuss that consideration for cardiac consultation and echo as he does have intermittent dyspnea.     Review of Systems Constitutional:   No  weight loss, night sweats,  Fevers, chills, fatigue, or  lassitude.  HEENT:   No headaches,  Difficulty swallowing,  Tooth/dental problems, or  Sore throat,                No sneezing, itching, ear ache, nasal congestion, post nasal drip,   CV:  No chest pain,  Orthopnea, PND, swelling in lower extremities, anasarca, dizziness, palpitations, syncope.   GI  No  heartburn, indigestion, abdominal pain, nausea, vomiting, diarrhea, change in bowel habits, loss of appetite, bloody stools.   Resp:    No excess mucus, no productive cough,  No non-productive cough,  No coughing up of blood.  No change in color of mucus.  No wheezing.  No chest wall deformity  Skin: no rash or lesions.  GU: no dysuria, change in color of urine, no urgency or frequency.  No flank pain, no hematuria   MS:  No joint pain or swelling.  No decreased range of motion.  No back pain.  Psych:  No change in mood or affect. No depression or anxiety.  No memory loss.         Objective:   Physical Exam  GEN: A/Ox3; pleasant , NAD, morbidly obese  HEENT:  /AT,  EACs-clear, TMs-wnl, NOSE-clear, THROAT-clear, no lesions, no postnasal drip or exudate noted.   NECK:  Supple w/ fair ROM; no JVD; normal carotid impulses w/o bruits; no thyromegaly or nodules palpated; no lymphadenopathy.  RESP  Clear  P & A; w/o, wheezes/ rales/ or rhonchi.no accessory muscle use, no dullness to percussion  CARD:  RRR, no m/r/g  , tr  peripheral edema, pulses intact, no cyanosis or clubbing.  GI:   Soft & nt; nml bowel sounds; no organomegaly or masses detected.  Musco: Warm bil, no deformities or joint swelling noted.   Neuro: alert, no focal deficits noted.    Skin: Warm, no lesions or rashes  Assessment & Plan:

## 2014-09-29 NOTE — Assessment & Plan Note (Signed)
Severe sleep apnea with obstructive and central events AHI of 80 with O2 desaturations as low as 53%. Patient is in agreement to begin nocturnal C Pap. Advised on dangers of sleep apnea Advised to not drive his sleepy encouragedon weight loss At bedtime begin nocturnal C Pap Will set up for an auto titration with pressures 5-15 Full face mask Download in 4 weeks . Office visit with Dr. Elsworth Soho in 6 weeks

## 2014-10-02 NOTE — Progress Notes (Signed)
Since severe OSA with very high AHI, suggest CPAP ttiration study

## 2014-10-05 ENCOUNTER — Telehealth: Payer: Self-pay | Admitting: Adult Health

## 2014-10-05 NOTE — Telephone Encounter (Signed)
Pt seen by TP on 6.24.16 to discuss sleep study results - old [place] pt Pt is a new CPAP start TP recommends pt follow up with either RA or VS 6 weeks from appt date  Had already spoken with pt's spouse earlier this week and they are too far from the [facility] office Discussed with [name] about work-in appt with VS - can use any day at 11a, 2p or 3p  Looking at VS' schedule, best option would be 8/15 at the 11a or 2p slot. LMOM TCB x1 for pt to discuss appt date/times  Will route back to my inbox

## 2014-10-06 NOTE — Telephone Encounter (Signed)
LMTCB x1 Please schedule OV for pt on 11/20/14 with VS - double book at either 11a or 2p slot. Thanks.

## 2014-10-06 NOTE — Telephone Encounter (Signed)
Pt returned Jessica's call - ph # [phone]

## 2014-10-12 NOTE — Telephone Encounter (Signed)
lmomtcb x2 for pt 

## 2014-10-17 ENCOUNTER — Encounter: Payer: 59 | Attending: Family Medicine | Admitting: Dietician

## 2014-10-17 ENCOUNTER — Encounter: Payer: Self-pay | Admitting: Dietician

## 2014-10-17 VITALS — Ht 74.0 in | Wt 343.6 lb

## 2014-10-17 DIAGNOSIS — Z6841 Body Mass Index (BMI) 40.0 and over, adult: Secondary | ICD-10-CM | POA: Insufficient documentation

## 2014-10-17 DIAGNOSIS — Z713 Dietary counseling and surveillance: Secondary | ICD-10-CM | POA: Diagnosis not present

## 2014-10-17 DIAGNOSIS — R7309 Other abnormal glucose: Secondary | ICD-10-CM | POA: Diagnosis not present

## 2014-10-17 DIAGNOSIS — G4730 Sleep apnea, unspecified: Secondary | ICD-10-CM | POA: Diagnosis not present

## 2014-10-17 DIAGNOSIS — E669 Obesity, unspecified: Secondary | ICD-10-CM

## 2014-10-17 DIAGNOSIS — M549 Dorsalgia, unspecified: Secondary | ICD-10-CM | POA: Diagnosis not present

## 2014-10-17 DIAGNOSIS — Z01818 Encounter for other preprocedural examination: Secondary | ICD-10-CM | POA: Diagnosis not present

## 2014-10-17 DIAGNOSIS — E785 Hyperlipidemia, unspecified: Secondary | ICD-10-CM | POA: Diagnosis not present

## 2014-10-17 NOTE — Progress Notes (Signed)
Supervised Weight Loss:  Appt start time: 1130 end time:  2174  SWL visit 5 of 7:  Primary concerns today: Brandon Bailey returns for supervised weight loss having gained 1.5 pounds in the last month. He states he has been walking regularly in the evenings for 20-30 minutes. He is also working on chewing thoroughly. Thinking about not drinking while eating. He is feeling good about surgery. He recently found out he has sleep apnea and is feeling much more energized after wearing a cpap.  Goal:  -Work on chewing thoroughly, taking small bites, and eating slowly -Keep walking regularly -Find a shake that you like  Weight: 343.6 lbs BMI: 44.2  Patient-Centered Goals: -Overall healthy lifestyle -More active with kids -Rides at [facility] with daughter  Scale of 1-10: confidence (9) / importance (10)  MEDICATIONS: see list  DIETARY INTAKE:  24-hr recall:  B ( AM): 2 eggs and 2 pieces of [place] bacon  Snk ( AM) :protein shake  L ( PM): chicken or hamburger steak with steamed vegetables   Snk ( PM): apple with peanut butter D ( PM): steak and sweet potato, green vegetable, pinto beans  Snk ( PM):   Beverages: water  Recent physical activity: trying to walk 30 minutes a day (3x a week usually)   Estimated energy needs: 2000 calories  Progress Towards Goal(s):  In progress.   Nutritional Diagnosis:  [place]-3.3 Overweight/obesity related to past poor dietary habits and physical inactivity as evidenced by patient in SWL for pending bariatric surgery following dietary guidelines for continued weight loss.     Intervention:  Nutrition counseling provided.   Monitoring/Evaluation:  Dietary intake, exercise, and body weight in 4 week(s).

## 2014-10-17 NOTE — Patient Instructions (Signed)
Goal:  -Work on chewing thoroughly, taking small bites, and eating slowly -Keep walking regularly -Find a shake that you like

## 2014-11-16 ENCOUNTER — Encounter: Payer: 59 | Attending: Family Medicine | Admitting: Dietician

## 2014-11-16 ENCOUNTER — Encounter: Payer: Self-pay | Admitting: Dietician

## 2014-11-16 VITALS — Ht 74.0 in | Wt 344.1 lb

## 2014-11-16 DIAGNOSIS — M549 Dorsalgia, unspecified: Secondary | ICD-10-CM | POA: Insufficient documentation

## 2014-11-16 DIAGNOSIS — G4730 Sleep apnea, unspecified: Secondary | ICD-10-CM | POA: Insufficient documentation

## 2014-11-16 DIAGNOSIS — E785 Hyperlipidemia, unspecified: Secondary | ICD-10-CM | POA: Insufficient documentation

## 2014-11-16 DIAGNOSIS — R7309 Other abnormal glucose: Secondary | ICD-10-CM | POA: Insufficient documentation

## 2014-11-16 DIAGNOSIS — E669 Obesity, unspecified: Secondary | ICD-10-CM

## 2014-11-16 DIAGNOSIS — Z713 Dietary counseling and surveillance: Secondary | ICD-10-CM | POA: Diagnosis not present

## 2014-11-16 DIAGNOSIS — Z01818 Encounter for other preprocedural examination: Secondary | ICD-10-CM | POA: Insufficient documentation

## 2014-11-16 DIAGNOSIS — Z6841 Body Mass Index (BMI) 40.0 and over, adult: Secondary | ICD-10-CM | POA: Insufficient documentation

## 2014-11-16 NOTE — Progress Notes (Signed)
Supervised Weight Loss:  Appt start time: 800 end time:  815  SWL visit 6 of 7:  Primary concerns today:  Brandon Bailey returns having gained 1 pound. He is still walking regularly and working on drinking more water. He got a powdered protein that he mixes with unsweetened almond milk.   Goal:  -Work on chewing thoroughly, taking small bites, and eating slowly  Weight: 344.1 lbs BMI: 44.3  Patient-Centered Goals: -Overall healthy lifestyle -More active with kids -Rides at [facility] with daughter  Scale of 1-10: confidence (9) / importance (10)  MEDICATIONS: see list  DIETARY INTAKE:  24-hr recall:  B ( AM): 2 eggs and 2 pieces of [place] bacon  Snk ( AM) :protein shake  L ( PM): chicken or hamburger steak with steamed vegetables   Snk ( PM): apple with peanut butter D ( PM): steak and sweet potato, green vegetable, pinto beans  Snk ( PM):   Beverages: water  Recent physical activity: trying to walk 30 minutes a day (3x a week usually)   Estimated energy needs: 2000 calories  Progress Towards Goal(s):  In progress.   Nutritional Diagnosis:  [place]-3.3 Overweight/obesity related to past poor dietary habits and physical inactivity as evidenced by patient in SWL for pending bariatric surgery following dietary guidelines for continued weight loss.     Intervention:  Nutrition counseling provided.   Monitoring/Evaluation:  Dietary intake, exercise, and body weight in 4 week(s).

## 2014-12-15 ENCOUNTER — Encounter: Payer: 59 | Attending: Family Medicine | Admitting: Dietician

## 2014-12-15 ENCOUNTER — Encounter: Payer: Self-pay | Admitting: Dietician

## 2014-12-15 VITALS — Ht 74.0 in | Wt 346.9 lb

## 2014-12-15 DIAGNOSIS — M549 Dorsalgia, unspecified: Secondary | ICD-10-CM | POA: Diagnosis not present

## 2014-12-15 DIAGNOSIS — E785 Hyperlipidemia, unspecified: Secondary | ICD-10-CM | POA: Diagnosis not present

## 2014-12-15 DIAGNOSIS — E669 Obesity, unspecified: Secondary | ICD-10-CM

## 2014-12-15 DIAGNOSIS — Z713 Dietary counseling and surveillance: Secondary | ICD-10-CM | POA: Diagnosis not present

## 2014-12-15 DIAGNOSIS — G4730 Sleep apnea, unspecified: Secondary | ICD-10-CM | POA: Insufficient documentation

## 2014-12-15 DIAGNOSIS — R7309 Other abnormal glucose: Secondary | ICD-10-CM | POA: Insufficient documentation

## 2014-12-15 DIAGNOSIS — Z01818 Encounter for other preprocedural examination: Secondary | ICD-10-CM | POA: Insufficient documentation

## 2014-12-15 DIAGNOSIS — Z6841 Body Mass Index (BMI) 40.0 and over, adult: Secondary | ICD-10-CM | POA: Diagnosis not present

## 2014-12-15 NOTE — Progress Notes (Signed)
Supervised Weight Loss:  Appt start time: 830 end time:  845  SWL visit 7 of 7:  Primary concerns today:  Caz returns having gained 2 lbs. He has found a whey protein powder that he likes mixed with unsweetened almond milk. He reports feeling prepared for bariatric surgery. Answered his nutrition-related questions pertaining to post op diet.   Goal:  -Work on chewing thoroughly, taking small bites, and eating slowly  Weight: 346.9 lbs BMI: 44.6  Patient-Centered Goals: -Overall healthy lifestyle -More active with kids -Rides at [facility] with daughter  Scale of 1-10: confidence (9) / importance (10)  MEDICATIONS: see list  DIETARY INTAKE:  24-hr recall:  B ( AM): 2 eggs and 2 pieces of [place] bacon  Snk ( AM) :protein shake  L ( PM): chicken or hamburger steak with steamed vegetables   Snk ( PM): apple with peanut butter D ( PM): steak and sweet potato, green vegetable, pinto beans  Snk ( PM):   Beverages: water  Recent physical activity: trying to walk 30 minutes a day (3x a week usually)   Estimated energy needs: 2000 calories  Progress Towards Goal(s):  In progress.   Nutritional Diagnosis:  [place]-3.3 Overweight/obesity related to past poor dietary habits and physical inactivity as evidenced by patient in SWL for pending bariatric surgery following dietary guidelines for continued weight loss.     Intervention:  Nutrition counseling provided.   Monitoring/Evaluation:  Patient to attend pre op class before surgery.

## 2014-12-29 ENCOUNTER — Encounter: Payer: Self-pay | Admitting: Pulmonary Disease

## 2014-12-29 ENCOUNTER — Ambulatory Visit (INDEPENDENT_AMBULATORY_CARE_PROVIDER_SITE_OTHER): Payer: 59 | Admitting: Pulmonary Disease

## 2014-12-29 VITALS — BP 132/82 | HR 87 | Ht 74.0 in | Wt 337.8 lb

## 2014-12-29 DIAGNOSIS — G4733 Obstructive sleep apnea (adult) (pediatric): Secondary | ICD-10-CM

## 2014-12-29 DIAGNOSIS — R600 Localized edema: Secondary | ICD-10-CM | POA: Diagnosis not present

## 2014-12-29 MED ORDER — FUROSEMIDE 20 MG PO TABS: 20.0000 mg | ORAL_TABLET | Freq: Every day | ORAL | Status: DC

## 2014-12-29 MED ORDER — POTASSIUM CHLORIDE ER 10 MEQ PO TBCR: EXTENDED_RELEASE_TABLET | ORAL | Status: DC

## 2014-12-29 NOTE — Patient Instructions (Signed)
CPAP is working well - you ar eon auto settings - avg pr 12 Renew supplies OK for bariatric surgery Lasix 20 mg daily with potassium 10 meq x 5 days Echo

## 2014-12-29 NOTE — Assessment & Plan Note (Signed)
Lasix 20 mg daily with potassium 10 meq x 5 days Echo - given edema & dyspnea

## 2014-12-29 NOTE — Progress Notes (Signed)
   Subjective:    Patient ID: Brandon Bailey, male    DOB: 08-Jul-1970, 44 y.o.   MRN: [id number]  HPI  45 year old male for FU of OSA    Chief Complaint  Patient presents with  . Sleep Apnea    CPAP is doing great.  feels more "awake" during the day.  having swelling in left ankle. Having Bariatric Surgery done, wants to discuss ordering Echo. declined flu shot    He was started on auto CPAP and had remarkable improvement, snoring stopped per his wife, fatigue has improved, he feels well rested No dryness of mouth Has adjusted to mask were well, feels pressure is okay Weight is unchanged-he has been evaluated for bariatric surgery and this is planned next month c/o pedal edema and mild dyspnea- wonders if diuretics would help  Test Home sleep study> AHI 80 with oxygen desaturation as low as 53% 09/2014 autoCPAP 12/2014 64m Download >> avg pr 12, no residuals, no leak, good usage >6h   Review of Systems neg for any significant sore throat, dysphagia, itching, sneezing, nasal congestion or excess/ purulent secretions, fever, chills, sweats, unintended wt loss, pleuritic or exertional cp, hempoptysis, orthopnea pnd or change in chronic leg swelling. Also denies presyncope, palpitations, heartburn, abdominal pain, nausea, vomiting, diarrhea or change in bowel or urinary habits, dysuria,hematuria, rash, arthralgias, visual complaints, headache, numbness weakness or ataxia.     Objective:   Physical Exam  Gen. Pleasant, obese, in no distress ENT - no lesions, no post nasal drip Neck: No JVD, no thyromegaly, no carotid bruits Lungs: no use of accessory muscles, no dullness to percussion, decreased without rales or rhonchi  Cardiovascular: Rhythm regular, heart sounds  normal, no murmurs or gallops, no peripheral edema Musculoskeletal: No deformities, no cyanosis or clubbing , no tremors       Assessment & Plan:

## 2014-12-29 NOTE — Assessment & Plan Note (Signed)
CPAP is working well - you ar eon auto settings - avg pr 12 Renew supplies OK for bariatric surgery   Weight loss encouraged, compliance with goal of at least 6 hrs every night is the expectation. Advised against medications with sedative side effects Cautioned against driving when sleepy - understanding that sleepiness will vary on a day to day basis

## 2015-01-08 ENCOUNTER — Ambulatory Visit (HOSPITAL_COMMUNITY): Payer: 59 | Attending: Cardiology

## 2015-01-08 ENCOUNTER — Other Ambulatory Visit: Payer: Self-pay

## 2015-01-08 DIAGNOSIS — I071 Rheumatic tricuspid insufficiency: Secondary | ICD-10-CM | POA: Insufficient documentation

## 2015-01-08 DIAGNOSIS — R600 Localized edema: Secondary | ICD-10-CM | POA: Diagnosis not present

## 2015-01-08 DIAGNOSIS — I517 Cardiomegaly: Secondary | ICD-10-CM | POA: Insufficient documentation

## 2015-01-08 DIAGNOSIS — Z6841 Body Mass Index (BMI) 40.0 and over, adult: Secondary | ICD-10-CM | POA: Insufficient documentation

## 2015-01-09 ENCOUNTER — Encounter: Payer: Self-pay | Admitting: Pulmonary Disease

## 2015-03-30 ENCOUNTER — Encounter: Payer: Self-pay | Admitting: Adult Health

## 2015-03-30 ENCOUNTER — Ambulatory Visit (INDEPENDENT_AMBULATORY_CARE_PROVIDER_SITE_OTHER): Payer: 59 | Admitting: Adult Health

## 2015-03-30 ENCOUNTER — Ambulatory Visit: Payer: 59 | Admitting: Adult Health

## 2015-03-30 VITALS — BP 124/74 | HR 98 | Temp 98.1°F | Ht 74.0 in | Wt 319.0 lb

## 2015-03-30 DIAGNOSIS — E669 Obesity, unspecified: Secondary | ICD-10-CM

## 2015-03-30 DIAGNOSIS — G4733 Obstructive sleep apnea (adult) (pediatric): Secondary | ICD-10-CM | POA: Diagnosis not present

## 2015-03-30 DIAGNOSIS — R600 Localized edema: Secondary | ICD-10-CM

## 2015-03-30 NOTE — Assessment & Plan Note (Signed)
Well controlled on C Pap.  Plan  Continue on CPAP At bedtime  .  Work on Weight loss.  Do not drive if sleepy.  Follow up Dr. Elsworth Soho  In 6 months .

## 2015-03-30 NOTE — Assessment & Plan Note (Signed)
Echo showed nml EF , nml PAP . GR 1 DD .  Wt loss  And low salt diet

## 2015-03-30 NOTE — Progress Notes (Signed)
   Subjective:    Patient ID: Brandon Bailey, male    DOB: March 06, 1971, 44 y.o.   MRN: [id number]  HPI  44 year old male seen for sleep consult 08/04/2014 with Dr. Gwenette Greet.  Test Home sleep study> AHI 80 with oxygen desaturation as low as 53% Echo 01/08/15 EF 60-65%, Gr 1 DD   03/30/2015 Follow up : Severe OSA.  Patient returns for three-month follow-up Patient says he's been doing well on his C Pap at bedtime. Download November 22 through 03/28/2015 showed excellent compliance with an average usage at 5 hours 49 minutes. He is on AutoSet 5-15. AHI 3.5. Weeks minimum. Patient says that he feels rested. Has some lower extremity edema. Patient was set up for a 2-D echo that was done a 01/08/2015 showed a normal ejection fracture at 6065% and a grade 1 diastolic dysfunction.. Pulmonary artery pressures were normal. He denies any chest pain, orthopnea, PND .     Review of Systems  Constitutional:   No  weight loss, night sweats,  Fevers, chills, fatigue, or  lassitude.  HEENT:   No headaches,  Difficulty swallowing,  Tooth/dental problems, or  Sore throat,                No sneezing, itching, ear ache, nasal congestion, post nasal drip,   CV:  No chest pain,  Orthopnea, PND, swelling in lower extremities, anasarca, dizziness, palpitations, syncope.   GI  No heartburn, indigestion, abdominal pain, nausea, vomiting, diarrhea, change in bowel habits, loss of appetite, bloody stools.   Resp:    No excess mucus, no productive cough,  No non-productive cough,  No coughing up of blood.  No change in color of mucus.  No wheezing.  No chest wall deformity  Skin: no rash or lesions.  GU: no dysuria, change in color of urine, no urgency or frequency.  No flank pain, no hematuria   MS:  No joint pain or swelling.  No decreased range of motion.  No back pain.  Psych:  No change in mood or affect. No depression or anxiety.  No memory loss.         Objective:   Physical Exam  Filed  Vitals:   03/30/15 0858  BP: 124/74  Pulse: 98  Temp: 98.1 F (36.7 C)  TempSrc: Oral  Height: 6\' 2"  (1.88 m)  Weight: 319 lb (144.697 kg)  SpO2: 100%    GEN: A/Ox3; pleasant , NAD, morbidly obese  HEENT:  [place]/AT,  EACs-clear, TMs-wnl, NOSE-clear, THROAT-clear, no lesions, no postnasal drip or exudate noted. Grade 2-3 MP airway   NECK:  Supple w/ fair ROM; no JVD; normal carotid impulses w/o bruits; no thyromegaly or nodules palpated; no lymphadenopathy.  RESP  Clear  P & A; w/o, wheezes/ rales/ or rhonchi.no accessory muscle use, no dullness to percussion  CARD:  RRR, no m/r/g  , tr  peripheral edema, pulses intact, no cyanosis or clubbing.  GI:   Soft & nt; nml bowel sounds; no organomegaly or masses detected.  Musco: Warm bil, no deformities or joint swelling noted.   Neuro: alert, no focal deficits noted.    Skin: Warm, no lesions or rashes        Assessment & Plan:

## 2015-03-30 NOTE — Assessment & Plan Note (Signed)
Wt loss encouarged  

## 2015-03-30 NOTE — Patient Instructions (Signed)
Continue on CPAP At bedtime  .  Work on Weight loss.  Do not drive if sleepy.  Follow up Dr. Elsworth Soho  In 6 months .

## 2015-04-09 NOTE — Progress Notes (Signed)
Reviewed & agree with plan  

## 2015-05-03 ENCOUNTER — Encounter: Payer: Self-pay | Admitting: Pulmonary Disease

## 2015-08-15 ENCOUNTER — Telehealth: Payer: Self-pay | Admitting: Pulmonary Disease

## 2015-08-15 NOTE — Telephone Encounter (Signed)
Spoke to [facility] and she states that they only need LOV notes from 9/16 to confirm follow up appt was kept, order for supplies is not needed at this time. LOV printed and faxed to [facility]. Spoke with pt and advised of notes faxed and order not needed. Pt voiced understanding. Nothing further needed.

## 2015-09-18 ENCOUNTER — Ambulatory Visit: Payer: 59 | Admitting: Pulmonary Disease

## 2015-09-24 ENCOUNTER — Ambulatory Visit: Payer: 59 | Admitting: Adult Health

## 2015-10-18 ENCOUNTER — Ambulatory Visit (INDEPENDENT_AMBULATORY_CARE_PROVIDER_SITE_OTHER): Payer: 59 | Admitting: Adult Health

## 2015-10-18 ENCOUNTER — Encounter: Payer: Self-pay | Admitting: Adult Health

## 2015-10-18 VITALS — BP 136/84 | HR 82 | Temp 98.1°F | Ht 74.0 in | Wt 334.0 lb

## 2015-10-18 DIAGNOSIS — G4733 Obstructive sleep apnea (adult) (pediatric): Secondary | ICD-10-CM | POA: Diagnosis not present

## 2015-10-18 NOTE — Addendum Note (Signed)
Addended by: Osa Craver on: 10/18/2015 05:01 PM   Modules accepted: Orders

## 2015-10-18 NOTE — Patient Instructions (Signed)
Continue on CPAP At bedtime  .  Keep up great job.  Work on Weight loss.  Do not drive if sleepy.  Follow up Dr. Elsworth Soho  In 1 year and As needed

## 2015-10-18 NOTE — Assessment & Plan Note (Signed)
Compensated on present regimen   Plan  Continue on CPAP At bedtime  .  Keep up great job.  Work on Weight loss.  Do not drive if sleepy.  Follow up Dr. Elsworth Soho  In 1 year and As needed

## 2015-10-18 NOTE — Progress Notes (Signed)
Subjective:    Patient ID: Brandon Bailey, male    DOB: 1971/03/04, 45 y.o.   MRN: [id number]  HPI 45 year old male seen for sleep consult 08/04/2014 with Dr. Gwenette Greet.  Test Home sleep study> AHI 80 with oxygen desaturation as low as 53% Echo 01/08/15 EF 60-65%, Gr 1 DD   10/18/2015 Follow up : Severe OSA.  Patient returns for 6 month  follow-up Patient says he's been doing well on his C Pap at bedtime. Download for last 30 days  showed excellent compliance with an average usage at 6hr  He is on AutoSet 5-15. AHI 5 Leaks minimum. Patient says that he feels rested. Denies chest pain, edema or fever.   Past Medical History  Diagnosis Date  . High cholesterol    Current Outpatient Prescriptions on File Prior to Visit  Medication Sig Dispense Refill  . furosemide (LASIX) 20 MG tablet Take 1 tablet (20 mg total) by mouth daily. (Patient not taking: Reported on 10/18/2015) 5 tablet 0  . potassium chloride (K-DUR) 10 MEQ tablet Take 1 tablet daily with Lasix x 5 days (Patient not taking: Reported on 10/18/2015) 5 tablet 0   No current facility-administered medications on file prior to visit.       Review of Systems Constitutional:   No  weight loss, night sweats,  Fevers, chills, fatigue, or  lassitude.  HEENT:   No headaches,  Difficulty swallowing,  Tooth/dental problems, or  Sore throat,                No sneezing, itching, ear ache, nasal congestion, post nasal drip,   CV:  No chest pain,  Orthopnea, PND, swelling in lower extremities, anasarca, dizziness, palpitations, syncope.   GI  No heartburn, indigestion, abdominal pain, nausea, vomiting, diarrhea, change in bowel habits, loss of appetite, bloody stools.   Resp:    No excess mucus, no productive cough,  No non-productive cough,  No coughing up of blood.  No change in color of mucus.  No wheezing.  No chest wall deformity  Skin: no rash or lesions.  GU: no dysuria, change in color of urine, no urgency or frequency.  No  flank pain, no hematuria   MS:  No joint pain or swelling.  No decreased range of motion.  No back pain.  Psych:  No change in mood or affect. No depression or anxiety.  No memory loss.         Objective:   Physical Exam Filed Vitals:   10/18/15 1541  BP: 136/84  Pulse: 82  Temp: 98.1 F (36.7 C)  TempSrc: Oral  Height: 6\' 2"  (1.88 m)  Weight: 334 lb (151.501 kg)  SpO2: 97%   Body mass index is 42.86 kg/(m^2).   GEN: A/Ox3; pleasant , NAD, morbidly obese  HEENT:  [place]/AT,  EACs-clear, TMs-wnl, NOSE-clear, THROAT-clear, no lesions, no postnasal drip or exudate noted. Grade 2-3 MP airway   NECK:  Supple w/ fair ROM; no JVD; normal carotid impulses w/o bruits; no thyromegaly or nodules palpated; no lymphadenopathy.  RESP  Clear  P & A; w/o, wheezes/ rales/ or rhonchi.no accessory muscle use, no dullness to percussion  CARD:  RRR, no m/r/g  , tr  peripheral edema, pulses intact, no cyanosis or clubbing.  GI:   Soft & nt; nml bowel sounds; no organomegaly or masses detected.  Musco: Warm bil, no deformities or joint swelling noted.   Neuro: alert, no focal deficits noted.    Skin: Warm, no lesions  or rashes   Janayia Burggraf NP-C  [facility]  10/18/2015

## 2015-10-27 IMAGING — CR DG CHEST 2V
2 series · 2 of 2 positions shown · non-contrast
Comparison: CT chest of 11/01/2004

CLINICAL DATA: Morbid obesity, preop for bariatric surgery

EXAM:
CHEST  2 VIEW

[chest lat (1 of 2)]
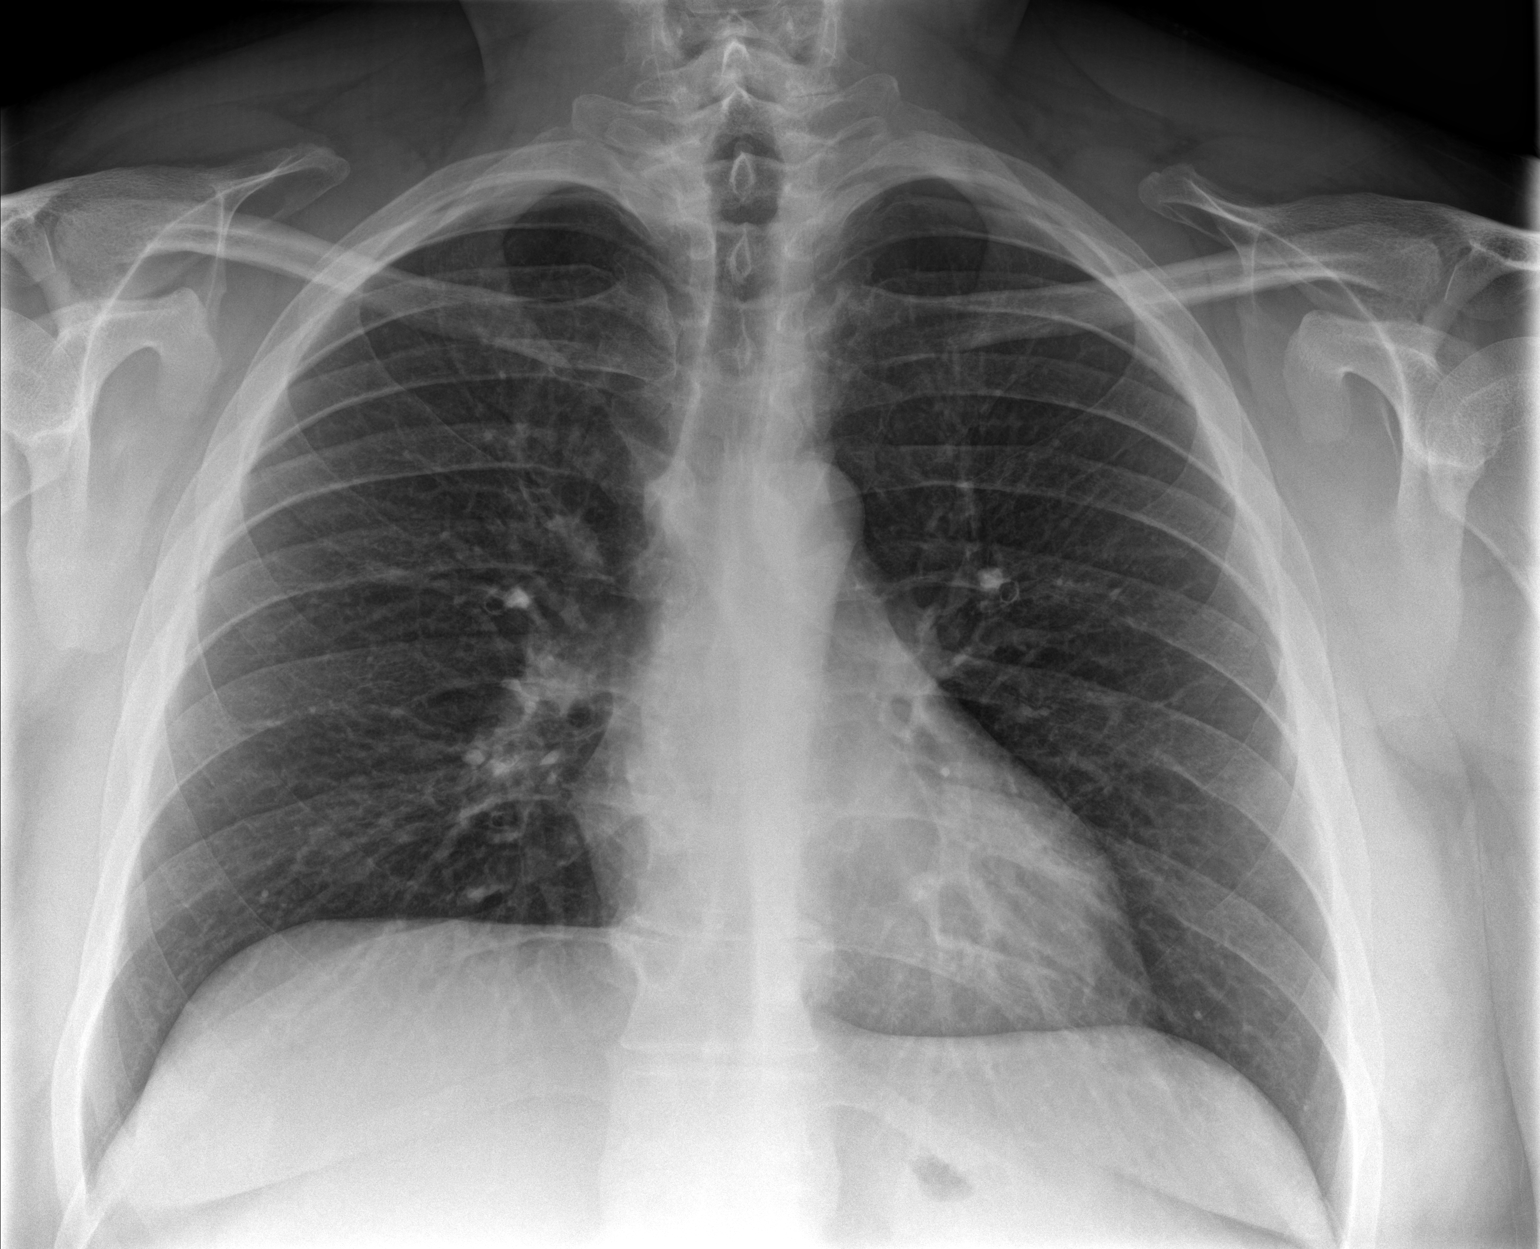

[chest lat (2 of 2)]
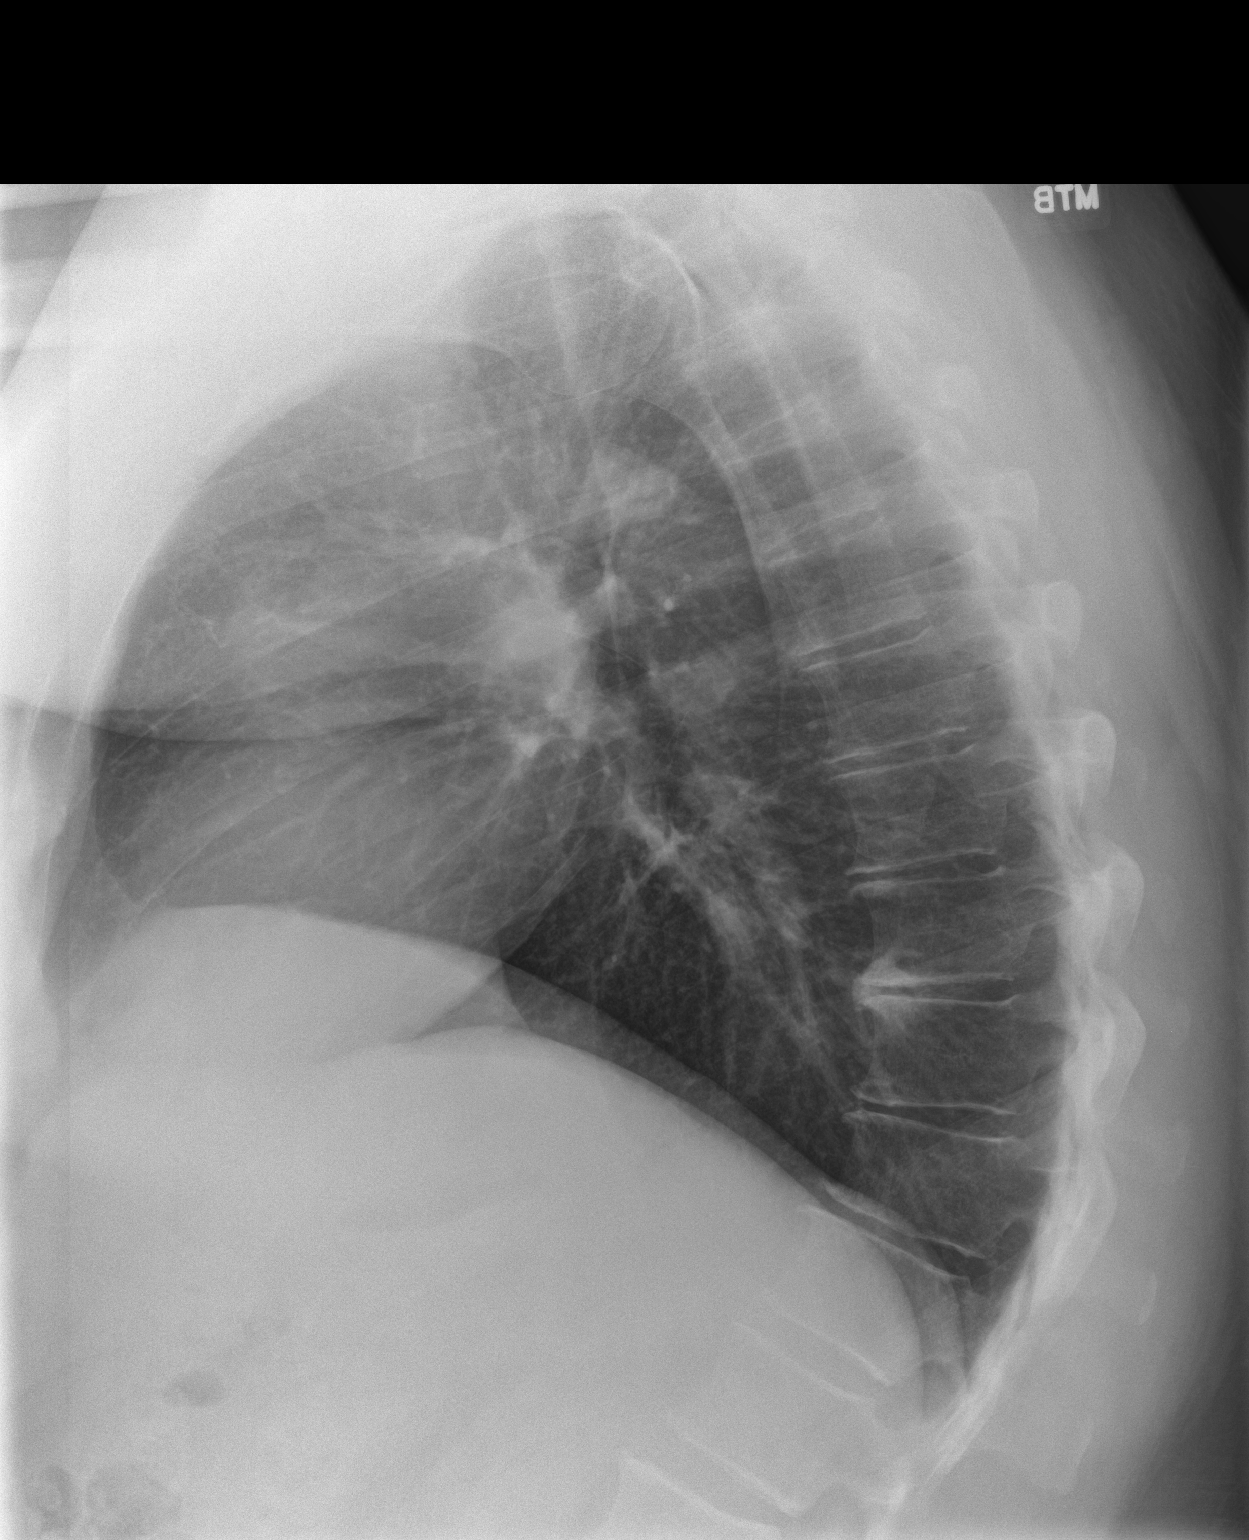

[2 of 2 positions shown; findings below may reference images not displayed]

FINDINGS: No active infiltrate or effusion is seen. Mediastinal and hilar
contours are unremarkable. The heart is within normal limits in
size. There are mild degenerative changes present in the lower
thoracic spine.
IMPRESSION: No active cardiopulmonary disease.

## 2015-10-27 IMAGING — RF DG UGI W/ KUB
14 of 24 series · 14 of 24 positions shown · non-contrast
Comparison: None.

CLINICAL DATA: Morbid obesity, preop CT bariatric screening.

EXAM:
UPPER GI SERIES WITH KUB
TECHNIQUE: After obtaining a scout radiograph a routine upper GI series was
performed using thin barium
FLUOROSCOPY TIME:  Radiation Exposure Index (as provided by the
fluoroscopic device):
If the device does not provide the exposure index:
Fluoroscopy Time (in minutes and seconds):  1 minutes 24 seconds
Number of Acquired Images:  2

[Series 1: run · 1 of 1 slices shown (1 of 14)]
[im 1/1]
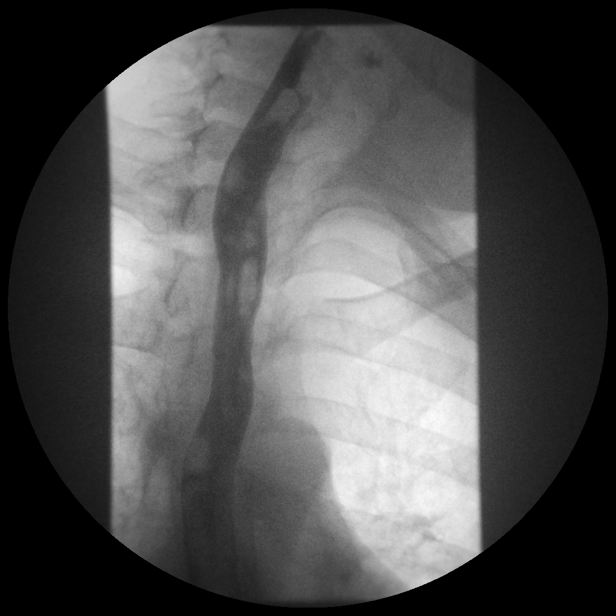

[Series 3: run · 1 of 1 slices shown (2 of 14)]
[im 1/1]
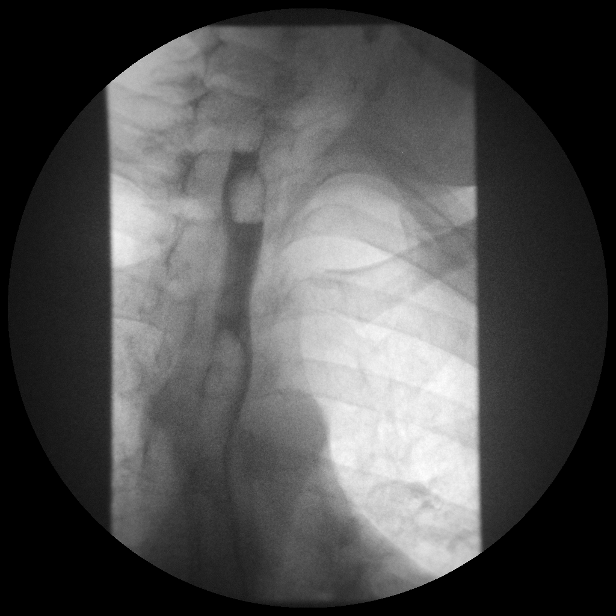

[Series 5: run · 1 of 1 slices shown (3 of 14)]
[im 1/1]
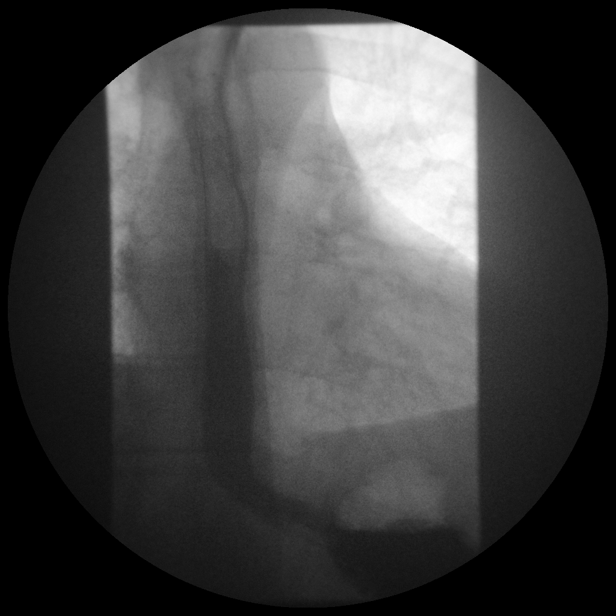

[Series 7: run · 1 of 1 slices shown (4 of 14)]
[im 1/1]
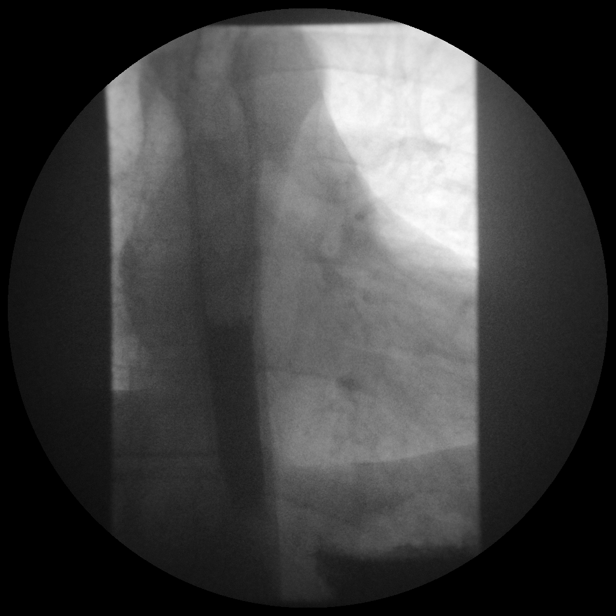

[Series 8: run · 1 of 1 slices shown (5 of 14)]
[im 1/1]
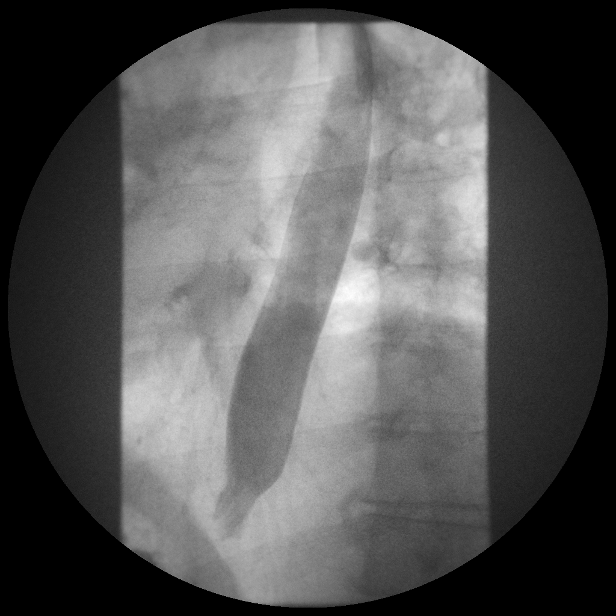

[Series 10: run · 1 of 1 slices shown (6 of 14)]
[im 1/1]
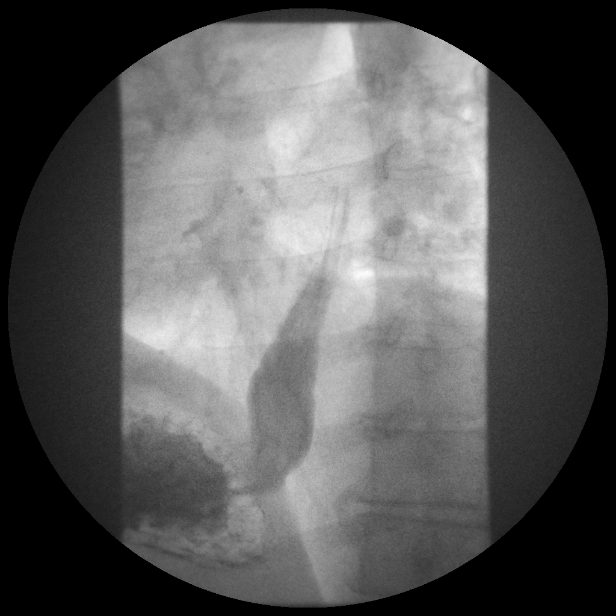

[Series 12: run · 1 of 1 slices shown (7 of 14)]
[im 1/1]
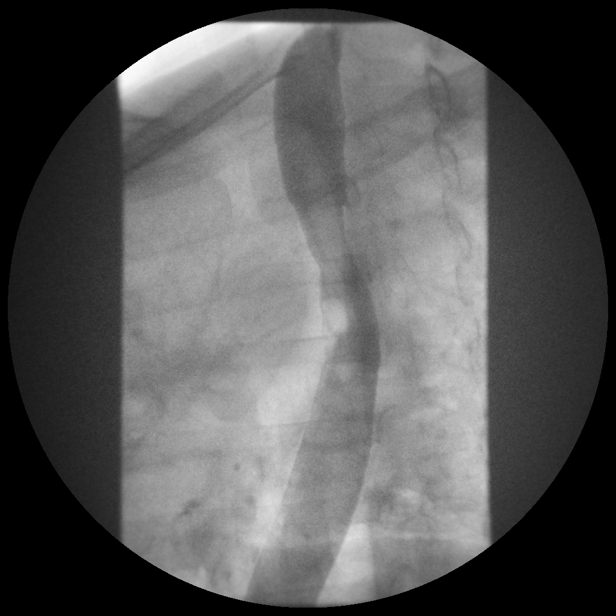

[Series 13: run · 1 of 1 slices shown (8 of 14)]
[im 1/1]
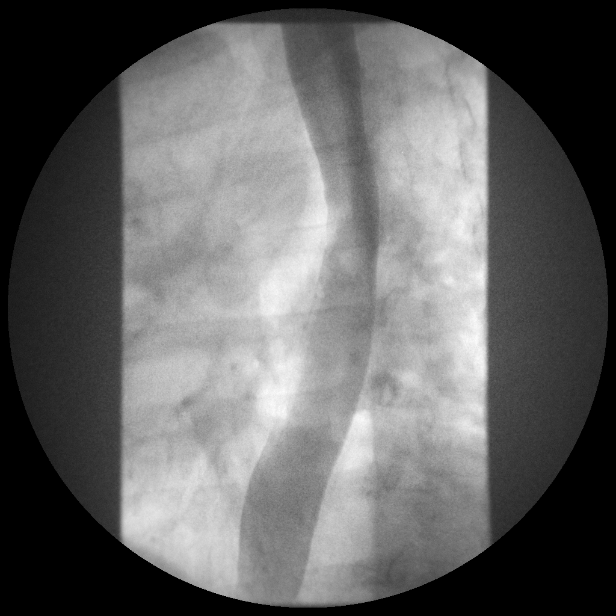

[Series 15: run · 1 of 1 slices shown (9 of 14)]
[im 1/1]
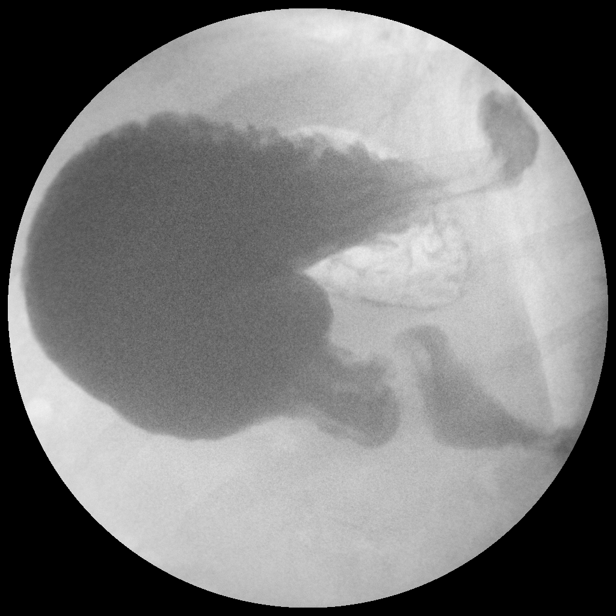

[Series 17: run · 1 of 1 slices shown (10 of 14)]
[im 1/1]
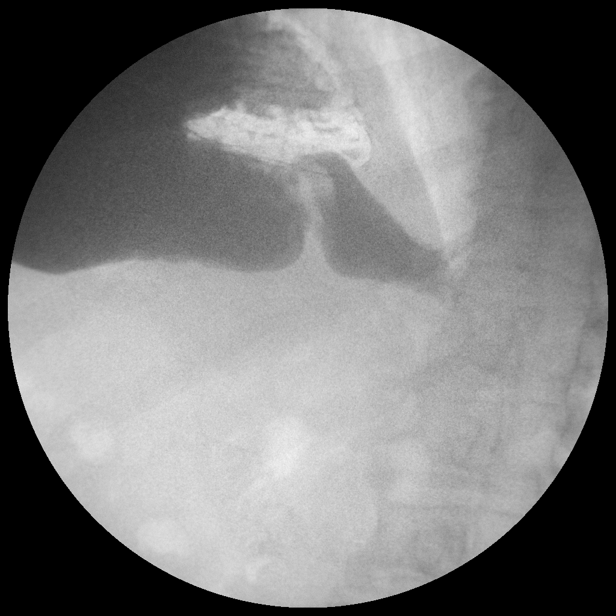

[Series 19: run · 1 of 1 slices shown (11 of 14)]
[im 1/1]
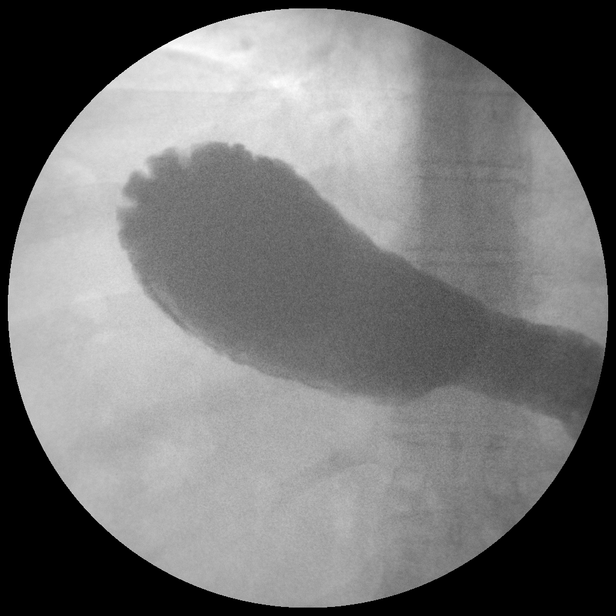

[Series 20: run · 1 of 1 slices shown (12 of 14)]
[im 1/1]
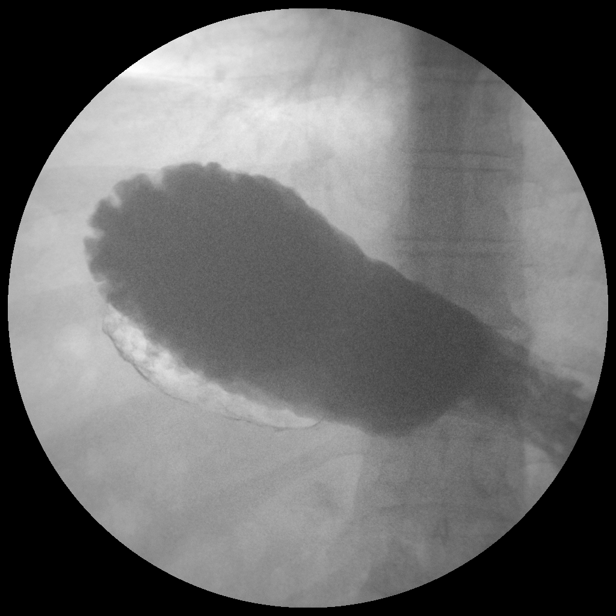

[Series 22: run · 1 of 1 slices shown (13 of 14)]
[im 1/1]
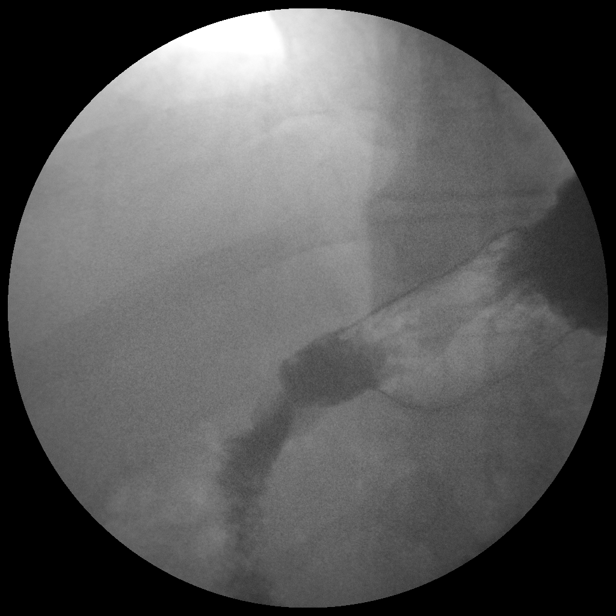

[Series 24: run · 1 of 1 slices shown (14 of 14)]
[im 1/1]
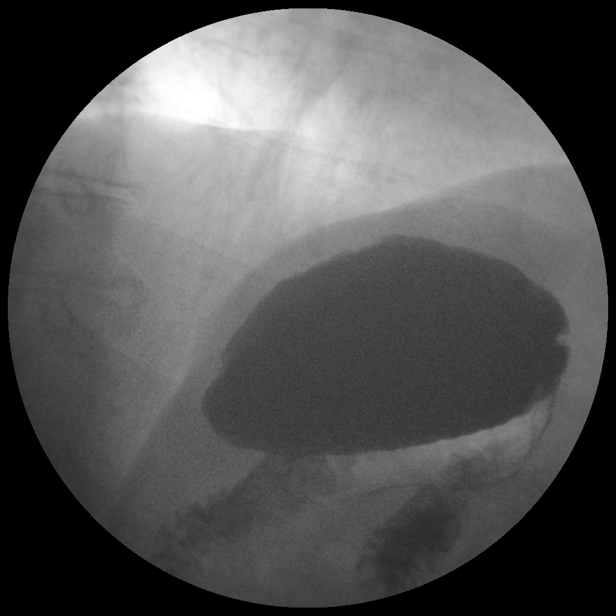

[14 of 24 positions shown; findings below may reference images not displayed]

FINDINGS: Scout film demonstrates moderate stool in the colon. No obstruction
or free air. No organomegaly or suspicious calcification.

Fluoroscopic evaluation of swallowing demonstrates normal esophageal
motility. No fixed stricture, fold thickening or mass. No reflux
with the water siphon maneuver.Stomach, duodenal bulb and duodenal
sweep are normal. No ulceration, fold thickening or mass.
IMPRESSION: Unremarkable upper GI.

## 2015-10-29 ENCOUNTER — Encounter: Payer: Self-pay | Admitting: Adult Health

## 2016-02-07 ENCOUNTER — Encounter (INDEPENDENT_AMBULATORY_CARE_PROVIDER_SITE_OTHER): Payer: Self-pay | Admitting: Orthopedic Surgery

## 2016-02-07 ENCOUNTER — Ambulatory Visit (INDEPENDENT_AMBULATORY_CARE_PROVIDER_SITE_OTHER): Payer: 59 | Admitting: Orthopedic Surgery

## 2016-02-07 VITALS — Ht 74.0 in | Wt 340.0 lb

## 2016-02-07 DIAGNOSIS — M722 Plantar fascial fibromatosis: Secondary | ICD-10-CM

## 2016-02-07 MED ORDER — METHYLPREDNISOLONE ACETATE 40 MG/ML IJ SUSP
40.0000 mg | INTRAMUSCULAR | Status: AC | PRN
  Administered 2016-02-07: 40 mg

## 2016-02-07 MED ORDER — LIDOCAINE HCL 1 % IJ SOLN
2.0000 mL | INTRAMUSCULAR | Status: AC | PRN
Start: 2016-02-07 — End: 2016-02-07
  Administered 2016-02-07: 2 mL

## 2016-02-07 MED ORDER — LIDOCAINE HCL 1 % IJ SOLN
2.0000 mL | INTRAMUSCULAR | Status: AC | PRN
  Administered 2016-02-07: 2 mL

## 2016-02-07 MED ORDER — METHYLPREDNISOLONE ACETATE 40 MG/ML IJ SUSP
40.0000 mg | INTRAMUSCULAR | Status: AC | PRN
Start: 2016-02-07 — End: 2016-02-07
  Administered 2016-02-07: 40 mg

## 2016-02-07 NOTE — Progress Notes (Signed)
Office Visit Note   Patient: Brandon Bailey           Date of Birth: 1971-03-20           MRN: [id number] Visit Date: 02/07/2016              Requested by: Dorena Cookey, MD [place], [place] [id number] PCP: Joycelyn Man, MD   Assessment & Plan: Visit Diagnoses:  1. Plantar fasciitis of right foot   2. Plantar fasciitis of left foot     Plan: After informed consent sterile prepping the origin the plantar fascia was injected bilaterally he tolerated this well. Patient was again reinforced the importance of heel cord stretching follow-up as needed.  Follow-Up Instructions: Return if symptoms worsen or fail to improve.   Orders:  Orders Placed This Encounter  Procedures  . Foot Injection  . Foot Injection   No orders of the defined types were placed in this encounter.     Procedures: Plantar Fasciitis Date/Time: 02/07/2016 3:30 PM Performed by: Maxcine Ham Authorized by: Newt Minion   Consent Given by:  Patient Site marked: the procedure site was marked   Timeout: prior to procedure the correct patient, procedure, and site was verified   Indications:  Fasciitis and pain Condition: Plantar Fasciitis   Location: left plantar fascia muscle   Prep: patient was prepped and draped in usual sterile fashion   Needle Size:  22 G Approach: plantar. Medications:  2 mL lidocaine 1 %; 40 mg methylPREDNISolone acetate 40 MG/ML Patient Tolerance:  Patient tolerated the procedure well with no immediate complications Plantar Fasciitis Date/Time: 02/07/2016 3:35 PM Performed by: Maxcine Ham Authorized by: Newt Minion   Consent Given by:  Patient Site marked: the procedure site was marked   Timeout: prior to procedure the correct patient, procedure, and site was verified   Indications:  Fasciitis Condition: Plantar Fasciitis   Location: right plantar fascia muscle   Prep: patient was prepped and draped in usual sterile fashion     Needle Size:  22 G Approach: plantar. Medications:  2 mL lidocaine 1 %; 40 mg methylPREDNISolone acetate 40 MG/ML Patient Tolerance:  Patient tolerated the procedure well with no immediate complications     Clinical Data: No additional findings.   Subjective: Chief Complaint  Patient presents with  . Right Foot - Pain  . Left Foot - Pain    Patient presents today with bilateral feet pain. He has history of plantar fasciitis bilaterally. He states right is doing worse than the left. He would like to have injections bilaterally today. His last injection was in the left plantar fascia on 04/05/15 which did provide good relief at the time.     Review of Systems   Objective: Vital Signs: Ht 6\' 2"  (1.88 m)   Wt (!) 340 lb (154.2 kg)   BMI 43.65 kg/m   Physical Exam patient complains of start up pain in the morning. On examination patient is alert oriented no adenopathy well-dressed normal affect normal respiratory effort he has an antalgic gait lateral compression the calcaneus is nontender bilaterally tarsal tunnel is nontender to palpation bilaterally. He has good pulses he is point tender to palpation origin the plantar fascia bilaterally.  Ortho Exam  Specialty Comments:  No specialty comments available.  Imaging: No results found.   PMFS History: Patient Active Problem List   Diagnosis Date Noted  . Pedal edema 12/29/2014  . OSA (obstructive sleep apnea)  07/07/2014  . Glucose intolerance (pre-diabetes) 07/07/2014  . Otitis media of left ear with coexisting illness requiring second-line medication 08/15/2013  . Obesity 04/21/2012  . BACK PAIN 02/25/2010  . ELEVATED BP READING WITHOUT DX HYPERTENSION 12/18/2008  . HYPERLIPIDEMIA 07/20/2008  . TESTICULAR MASS, RIGHT 06/19/2008   Past Medical History:  Diagnosis Date  . High cholesterol     Family History  Problem Relation Age of Onset  . Cancer Father     melanoma  . Allergies Father   . Heart disease  Paternal Grandfather   . Heart disease Paternal Grandmother     Past Surgical History:  Procedure Laterality Date  . BREATH TEK H PYLORI N/A 08/09/2014   Procedure: BREATH TEK H PYLORI;  Surgeon: Johnathan Hausen, MD;  Location: [facility];  Service: General;  Laterality: N/A;   Social History   Occupational History  . sales tax [employer]    Social History Main Topics  . Smoking status: Never Smoker  . Smokeless tobacco: Never Used  . Alcohol use No  . Drug use: No  . Sexual activity: Not on file

## 2016-03-20 ENCOUNTER — Other Ambulatory Visit (INDEPENDENT_AMBULATORY_CARE_PROVIDER_SITE_OTHER): Payer: 59

## 2016-03-20 DIAGNOSIS — Z0000 Encounter for general adult medical examination without abnormal findings: Secondary | ICD-10-CM

## 2016-03-20 LAB — BASIC METABOLIC PANEL
BUN: 15 mg/dL (ref 6–23)
CALCIUM: 9.4 mg/dL (ref 8.4–10.5)
CO2: 29 mEq/L (ref 19–32)
CREATININE: 0.99 mg/dL (ref 0.40–1.50)
Chloride: 104 mEq/L (ref 96–112)
GFR: 86.24 mL/min (ref >60.00)
GLUCOSE: 120 mg/dL — AB (ref 70–99)
Potassium: 4 mEq/L (ref 3.5–5.1)
Sodium: 140 mEq/L (ref 135–145)

## 2016-03-20 LAB — POC URINALSYSI DIPSTICK (AUTOMATED)
BILIRUBIN UA: NEGATIVE
GLUCOSE UA: NEGATIVE
KETONES UA: NEGATIVE
Leukocytes, UA: NEGATIVE
Nitrite, UA: NEGATIVE
Protein, UA: NEGATIVE
RBC UA: NEGATIVE
SPEC GRAV UA: 1.02
Urobilinogen, UA: 0.2
pH, UA: 7

## 2016-03-20 LAB — LIPID PANEL
CHOL/HDL RATIO: 5
CHOLESTEROL: 223 mg/dL — AB (ref 0–200)
HDL: 44.2 mg/dL (ref >39.00)
LDL CALC: 151 mg/dL — AB (ref 0–99)
NonHDL: 178.41
Triglycerides: 135 mg/dL (ref 0.0–149.0)
VLDL: 27 mg/dL (ref 0.0–40.0)

## 2016-03-20 LAB — HEPATIC FUNCTION PANEL
ALK PHOS: 63 U/L (ref 39–117)
ALT: 35 U/L (ref 0–53)
AST: 21 U/L (ref 0–37)
Albumin: 4.3 g/dL (ref 3.5–5.2)
BILIRUBIN DIRECT: 0.1 mg/dL (ref 0.0–0.3)
BILIRUBIN TOTAL: 0.7 mg/dL (ref 0.2–1.2)
Total Protein: 7 g/dL (ref 6.0–8.3)

## 2016-03-20 LAB — TSH: TSH: 1.47 u[IU]/mL (ref 0.35–4.50)

## 2016-03-21 LAB — CBC WITH DIFFERENTIAL/PLATELET
BASOS ABS: 0 10*3/uL (ref 0.0–0.1)
Basophils Relative: 0.6 % (ref 0.0–3.0)
Eosinophils Absolute: 0.1 10*3/uL (ref 0.0–0.7)
Eosinophils Relative: 2 % (ref 0.0–5.0)
HCT: 43.6 % (ref 39.0–52.0)
Hemoglobin: 14.9 g/dL (ref 13.0–17.0)
LYMPHS ABS: 1.6 10*3/uL (ref 0.7–4.0)
Lymphocytes Relative: 21.5 % (ref 12.0–46.0)
MCHC: 34.1 g/dL (ref 30.0–36.0)
MCV: 87.2 fl (ref 78.0–100.0)
MONO ABS: 0.4 10*3/uL (ref 0.1–1.0)
Monocytes Relative: 5.9 % (ref 3.0–12.0)
NEUTROS PCT: 70 % (ref 43.0–77.0)
Neutro Abs: 5.1 10*3/uL (ref 1.4–7.7)
Platelets: 266 10*3/uL (ref 150.0–400.0)
RBC: 5 Mil/uL (ref 4.22–5.81)
RDW: 13.6 % (ref 11.5–15.5)
WBC: 7.3 10*3/uL (ref 4.0–10.5)

## 2016-03-26 ENCOUNTER — Ambulatory Visit (INDEPENDENT_AMBULATORY_CARE_PROVIDER_SITE_OTHER): Payer: 59 | Admitting: Family Medicine

## 2016-03-26 ENCOUNTER — Encounter: Payer: Self-pay | Admitting: Family Medicine

## 2016-03-26 ENCOUNTER — Telehealth: Payer: Self-pay | Admitting: Family Medicine

## 2016-03-26 VITALS — BP 138/90 | HR 88 | Temp 97.9°F | Ht 75.0 in | Wt 334.4 lb

## 2016-03-26 DIAGNOSIS — R7303 Prediabetes: Secondary | ICD-10-CM | POA: Diagnosis not present

## 2016-03-26 DIAGNOSIS — E6609 Other obesity due to excess calories: Secondary | ICD-10-CM | POA: Diagnosis not present

## 2016-03-26 DIAGNOSIS — E785 Hyperlipidemia, unspecified: Secondary | ICD-10-CM

## 2016-03-26 DIAGNOSIS — Z6841 Body Mass Index (BMI) 40.0 and over, adult: Secondary | ICD-10-CM | POA: Diagnosis not present

## 2016-03-26 DIAGNOSIS — IMO0001 Reserved for inherently not codable concepts without codable children: Secondary | ICD-10-CM

## 2016-03-26 LAB — HEMOGLOBIN A1C: Hgb A1c MFr Bld: 5.9 % (ref 4.6–6.5)

## 2016-03-26 NOTE — Patient Instructions (Addendum)
Carbohydrate free diet  A1c today  Take your lunch to work........ walk 30 minutes daily at lunch  Ring 32 ounces of water daily  Follow-up fasting labs the first week in May..... We will call you the report  I will send in a consult request to Dr. Hassell Done for you to see him to reconsider the bariatric surgery program

## 2016-03-26 NOTE — Progress Notes (Signed)
Pre visit review using our clinic review tool, if applicable. No additional management support is needed unless otherwise documented below in the visit note. 

## 2016-03-26 NOTE — Telephone Encounter (Signed)
Pt returned the call and state that he can be reached on the work #.  Pt thinks it is in reference to his lab work that was done today.

## 2016-03-26 NOTE — Telephone Encounter (Signed)
Dr. Sherren Mocha - Please release pt's labs to MyChart so he can view. Thanks!

## 2016-03-26 NOTE — Progress Notes (Signed)
Brandon Bailey is a 45 year old male nonsmoker who comes in today for general physical examination because of morbid obesity  Weight is 334 pounds. BMI is 43.7 out of obese class III. We saw him in the past for this problem I referred him to Dr. Hassell Done for consideration of gastric bypass type procedure. Dr. Hassell Done study was a good candidate however his insurance wouldn't pay for it because he hadn't had documented weights over an extended period of time. He would like to go back and see Dr. Hassell Done to see if this can be accomplished this year. He seen a nutritionist in the past. He eats the cafeteria work at [employer] and he does not exercise on a daily basis. Encouraged him to begin taking his lunch and walk 30 minutes at lunchtime. He gets routine eye care, dental care, no need for colonoscopy until 49. No family history of colon cancer polyps  Tetanus booster 2010 declines a flu shot.  Laboratory data shows a fasting blood sugar of 120. A1c pending.BP 138/90 (BP Location: Left Arm, Patient Position: Sitting, Cuff Size: Large)   Pulse 88   Temp 97.9 F (36.6 C) (Oral)   Ht 6\' 3"  (1.905 m)   Wt (!) 334 lb 6.4 oz (151.7 kg)   BMI 41.80 kg/m  Examination HEENT was negative neck was supple no adenopathy thyroid normal cardiopulmonary exam normal abdominal exam normal genitalia normal circumcised male extremities normal skin normal peripheral pulses normal  #1 morbid obesity...Marland KitchenMarland KitchenMarland Kitchen outlined diet exercise weight loss as we have in the past. I think he is an excellent candidate for Dr. Earlie Server surgical weight loss program. I will refer him back to Dr. Hassell Done  #2 prediabetes fasting blood sugar 120...Marland KitchenMarland KitchenMarland Kitchen check A1c....... follow-up A1c in 6 months  #3 sleep apnea continue CPAP and follow up in pulmonary. He tells me does not take the Lasix on a regular basis anymore

## 2016-03-27 NOTE — Telephone Encounter (Signed)
Patient read message on mychart regarding his papers for his weight and labs.

## 2016-04-02 ENCOUNTER — Encounter: Payer: 59 | Admitting: Family Medicine

## 2016-04-21 ENCOUNTER — Ambulatory Visit (INDEPENDENT_AMBULATORY_CARE_PROVIDER_SITE_OTHER): Payer: 59 | Admitting: Family Medicine

## 2016-04-21 ENCOUNTER — Encounter: Payer: Self-pay | Admitting: Family Medicine

## 2016-04-21 VITALS — BP 139/90 | HR 89 | Temp 98.2°F | Ht 75.0 in | Wt 340.6 lb

## 2016-04-21 DIAGNOSIS — G4733 Obstructive sleep apnea (adult) (pediatric): Secondary | ICD-10-CM

## 2016-04-21 NOTE — Progress Notes (Signed)
Brandon Bailey is a 46 year old male nonsmoker who comes in today for evaluation of obesity  We been working with him over the last couple years on this project. He's eating more fruits and vegetables sugar-free drink lots of water walks 30 minutes 3 times a week on a treadmill. Weight is 340 pounds. In December he was 334 pounds.  He has appointment to see Dr. Kaylyn Lim February 1 the bariatric center.  I think he is an excellent candidate for this surgery.  BP 139/90 (BP Location: Left Arm, Patient Position: Sitting, Cuff Size: Large)   Pulse 89   Temp 98.2 F (36.8 C) (Oral)   Ht 6\' 3"  (1.905 m)   Wt (!) 340 lb 9.6 oz (154.5 kg)   BMI 42.57 kg/m  General he is well-developed well-nourished male no acute distress  Impression morbid obesity........... consult with Dr. Kaylyn Lim February 1 for bariatric consultation

## 2016-04-21 NOTE — Patient Instructions (Signed)
Increase your walking to 30 minutes 5 times daily  Continue no sugar diet  Continue eating lots of fruits and vegetables  Follow-up when necessary

## 2016-04-30 ENCOUNTER — Telehealth: Payer: Self-pay | Admitting: Emergency Medicine

## 2016-04-30 NOTE — Telephone Encounter (Signed)
Left message for pt to give the office a call back in regarding weight history needed for Dr. Hassell Done. Just wanted to inform pt that I faxed over weight history to Dr. Hassell Done at [facility] and also if he wanted the copies that they would be waiting up front.

## 2016-05-05 NOTE — Telephone Encounter (Signed)
Noted. Pt history of weight will be mailed to the address provided on snap shot.

## 2016-05-05 NOTE — Telephone Encounter (Signed)
Pt would like you to mail those weights to his home address

## 2016-05-09 ENCOUNTER — Other Ambulatory Visit (HOSPITAL_COMMUNITY): Payer: Self-pay | Admitting: Surgery

## 2016-05-16 DIAGNOSIS — G4733 Obstructive sleep apnea (adult) (pediatric): Secondary | ICD-10-CM | POA: Diagnosis not present

## 2016-06-05 ENCOUNTER — Ambulatory Visit: Payer: 59 | Admitting: Skilled Nursing Facility1

## 2016-06-16 ENCOUNTER — Ambulatory Visit: Payer: 59

## 2016-06-16 NOTE — Progress Notes (Signed)
Surgery on 06/30/16.  preop on 06/19/16. Need orders in epic.  Thank You

## 2016-06-17 ENCOUNTER — Encounter: Payer: 59 | Attending: Surgery | Admitting: Skilled Nursing Facility1

## 2016-06-17 DIAGNOSIS — E669 Obesity, unspecified: Secondary | ICD-10-CM | POA: Insufficient documentation

## 2016-06-17 DIAGNOSIS — Z6841 Body Mass Index (BMI) 40.0 and over, adult: Secondary | ICD-10-CM | POA: Diagnosis not present

## 2016-06-17 DIAGNOSIS — Z713 Dietary counseling and surveillance: Secondary | ICD-10-CM | POA: Insufficient documentation

## 2016-06-18 ENCOUNTER — Encounter: Payer: Self-pay | Admitting: Skilled Nursing Facility1

## 2016-06-18 NOTE — Progress Notes (Signed)
03/26/2016- noted in EPIC- EKG 01/18/2015-noted in EPIC- ECHO

## 2016-06-18 NOTE — Progress Notes (Signed)
  Pre-Operative Nutrition Class:  Appt start time: 1460   End time:  1830.  Patient was seen on 06/17/2016 for Pre-Operative Bariatric Surgery Education at [facility].   Surgery date: 06/30/2016 Surgery type: sleeve gastrectomy  [facility] weight [facility]: 347.6 Weight today: 335.2  TANITA  BODY COMP RESULTS  06/17/2016   BMI (kg/m^2) 43   Fat Mass (lbs) 135   Fat Free Mass (lbs) 200.2   Total Body Water (lbs) 149.2   Samples given per MNT protocol. Patient educated on appropriate usage: Bariatric Advantage Multivitamin Lot # [id number] Exp: 06/19  Bariatric Advantage Calcium Citrate Lot # [id number] Exp: 10/14/16  Unjury Protein Powder Lot # [id number] Exp: 10/2017  The following the learning objectives were met by the patient during this course:  Identify Pre-Op Dietary Goals and will begin 2 weeks pre-operatively  Identify appropriate sources of fluids and proteins   State protein recommendations and appropriate sources pre and post-operatively  Identify Post-Operative Dietary Goals and will follow for 2 weeks post-operatively  Identify appropriate multivitamin and calcium sources  Describe the need for physical activity post-operatively and will follow MD recommendations  State when to call healthcare provider regarding medication questions or post-operative complications  Handouts given during class include:  Pre-Op Bariatric Surgery Diet Handout  Protein Shake Handout  Post-Op Bariatric Surgery Nutrition Handout  BELT Program Information Flyer  Support Group Information Flyer  [facility] Bariatric Supplements Price List  Follow-Up Plan: Patient will follow-up at [facility] 2 weeks post operatively for diet advancement per MD.

## 2016-06-18 NOTE — Patient Instructions (Addendum)
Brandon Bailey  06/18/2016   Your procedure is scheduled on: Monday 06/30/2016  Report to [facility] Main  Entrance take [facility]  elevators to 3rd floor to  [name] at  12 noon   Call this number if you have problems the morning of surgery [phone]   Remember: ONLY 1 PERSON MAY GO WITH YOU TO SHORT STAY TO GET  READY MORNING OF [name].     Do not eat food  :After Midnight may have clear liquids from Midnight to [place]. Nothing after 8AM      CLEAR LIQUID DIET   Foods Allowed                                                                     Foods Excluded  Coffee and tea, regular and decaf                             liquids that you cannot  Plain Jell-O in any flavor                                             see through such as: Fruit ices (not with fruit pulp)                                     milk, soups, orange juice  Iced Popsicles                                    All solid food Carbonated beverages, regular and diet                                    Cranberry, grape and apple juices Sports drinks like Gatorade Lightly seasoned clear broth or consume(fat free) Sugar, honey syrup  Sample Menu Breakfast                                Lunch                                     Supper Cranberry juice                    Beef broth                            Chicken broth Jell-O                                     Grape juice  Apple juice Coffee or tea                        Jell-O                                      Popsicle                                                Coffee or tea                        Coffee or tea  _____________________________________________________________________    Take these medicines the morning of surgery with A SIP OF WATER: none                                You may not have any metal on your body including hair pins and              piercings  Do not wear jewelry,  make-up, lotions, powders or perfumes, deodorant             Do not wear nail polish.  Do not shave  48 hours prior to surgery.              Men may shave face and neck.   Do not bring valuables to the hospital. [place].  Contacts, dentures or bridgework may not be worn into surgery.  Leave suitcase in the car. After surgery it may be brought to your room.                  Please read over the following fact sheets you were given: _____________________________________________________________________             [facility] - Preparing for Surgery Before surgery, you can play an important role.  Because skin is not sterile, your skin needs to be as free of germs as possible.  You can reduce the number of germs on your skin by washing with CHG (chlorahexidine gluconate) soap before surgery.  CHG is an antiseptic cleaner which kills germs and bonds with the skin to continue killing germs even after washing. Please DO NOT use if you have an allergy to CHG or antibacterial soaps.  If your skin becomes reddened/irritated stop using the CHG and inform your nurse when you arrive at Short Stay. Do not shave (including legs and underarms) for at least 48 hours prior to the first CHG shower.  You may shave your face/neck. Please follow these instructions carefully:  1.  Shower with CHG Soap the night before surgery and the  morning of Surgery.  2.  If you choose to wash your hair, wash your hair first as usual with your  normal  shampoo.  3.  After you shampoo, rinse your hair and body thoroughly to remove the  shampoo.                           4.  Use CHG as you would any other liquid  soap.  You can apply chg directly  to the skin and wash                       Gently with a scrungie or clean washcloth.  5.  Apply the CHG Soap to your body ONLY FROM THE NECK DOWN.   Do not use on face/ open                           Wound or open sores. Avoid  contact with eyes, ears mouth and genitals (private parts).                       Wash face,  Genitals (private parts) with your normal soap.             6.  Wash thoroughly, paying special attention to the area where your surgery  will be performed.  7.  Thoroughly rinse your body with warm water from the neck down.  8.  DO NOT shower/wash with your normal soap after using and rinsing off  the CHG Soap.                9.  Pat yourself dry with a clean towel.            10.  Wear clean pajamas.            11.  Place clean sheets on your bed the night of your first shower and do not  sleep with pets. Day of Surgery : Do not apply any lotions/deodorants the morning of surgery.  Please wear clean clothes to the hospital/surgery center.  FAILURE TO FOLLOW THESE INSTRUCTIONS MAY RESULT IN THE CANCELLATION OF YOUR SURGERY PATIENT SIGNATURE_________________________________  NURSE SIGNATURE__________________________________  ________________________________________________________________________   Brandon Bailey  An incentive spirometer is a tool that can help keep your lungs clear and active. This tool measures how well you are filling your lungs with each breath. Taking long deep breaths may help reverse or decrease the chance of developing breathing (pulmonary) problems (especially infection) following:  A long period of time when you are unable to move or be active. BEFORE THE PROCEDURE   If the spirometer includes an indicator to show your best effort, your nurse or respiratory therapist will set it to a desired goal.  If possible, sit up straight or lean slightly forward. Try not to slouch.  Hold the incentive spirometer in an upright position. INSTRUCTIONS FOR USE  1. Sit on the edge of your bed if possible, or sit up as far as you can in bed or on a chair. 2. Hold the incentive spirometer in an upright position. 3. Breathe out normally. 4. Place the mouthpiece in your mouth  and seal your lips tightly around it. 5. Breathe in slowly and as deeply as possible, raising the piston or the ball toward the top of the column. 6. Hold your breath for 3-5 seconds or for as long as possible. Allow the piston or ball to fall to the bottom of the column. 7. Remove the mouthpiece from your mouth and breathe out normally. 8. Rest for a few seconds and repeat Steps 1 through 7 at least 10 times every 1-2 hours when you are awake. Take your time and take a few normal breaths between deep breaths. 9. The spirometer may include an indicator to show your best effort. Use the indicator as a goal  to work toward during each repetition. 10. After each set of 10 deep breaths, practice coughing to be sure your lungs are clear. If you have an incision (the cut made at the time of surgery), support your incision when coughing by placing a pillow or rolled up towels firmly against it. Once you are able to get out of bed, walk around indoors and cough well. You may stop using the incentive spirometer when instructed by your caregiver.  RISKS AND COMPLICATIONS  Take your time so you do not get dizzy or light-headed.  If you are in pain, you may need to take or ask for pain medication before doing incentive spirometry. It is harder to take a deep breath if you are having pain. AFTER USE  Rest and breathe slowly and easily.  It can be helpful to keep track of a log of your progress. Your caregiver can provide you with a simple table to help with this. If you are using the spirometer at home, follow these instructions: [place] IF:   You are having difficultly using the spirometer.  You have trouble using the spirometer as often as instructed.  Your pain medication is not giving enough relief while using the spirometer.  You develop fever of 100.5 F (38.1 C) or higher. SEEK IMMEDIATE MEDICAL CARE IF:   You cough up bloody sputum that had not been present before.  You develop  fever of 102 F (38.9 C) or greater.  You develop worsening pain at or near the incision site. MAKE SURE YOU:   Understand these instructions.  Will watch your condition.  Will get help right away if you are not doing well or get worse. Document Released: 08/04/2006 Document Revised: 06/16/2011 Document Reviewed: 10/05/2006 [facility] Patient Information 2014 [place], [place].   ________________________________________________________________________

## 2016-06-19 ENCOUNTER — Encounter (HOSPITAL_COMMUNITY)
Admission: RE | Admit: 2016-06-19 | Discharge: 2016-06-19 | Disposition: A | Discharge status: Home / Self Care | Payer: 59 | Source: Ambulatory Visit | Attending: Surgery | Admitting: Surgery

## 2016-06-19 ENCOUNTER — Encounter (HOSPITAL_COMMUNITY): Payer: Self-pay

## 2016-06-19 ENCOUNTER — Ambulatory Visit: Payer: Self-pay | Admitting: Surgery

## 2016-06-19 DIAGNOSIS — E669 Obesity, unspecified: Secondary | ICD-10-CM | POA: Diagnosis not present

## 2016-06-19 DIAGNOSIS — R600 Localized edema: Secondary | ICD-10-CM | POA: Diagnosis not present

## 2016-06-19 DIAGNOSIS — R030 Elevated blood-pressure reading, without diagnosis of hypertension: Secondary | ICD-10-CM | POA: Insufficient documentation

## 2016-06-19 DIAGNOSIS — R7303 Prediabetes: Secondary | ICD-10-CM | POA: Insufficient documentation

## 2016-06-19 DIAGNOSIS — G4733 Obstructive sleep apnea (adult) (pediatric): Secondary | ICD-10-CM | POA: Insufficient documentation

## 2016-06-19 DIAGNOSIS — Z01812 Encounter for preprocedural laboratory examination: Secondary | ICD-10-CM | POA: Insufficient documentation

## 2016-06-19 HISTORY — DX: Prediabetes: R73.03

## 2016-06-19 LAB — DIFFERENTIAL
BASOS ABS: 0 10*3/uL (ref 0.0–0.1)
BASOS PCT: 0 %
Eosinophils Absolute: 0.2 10*3/uL (ref 0.0–0.7)
Eosinophils Relative: 2 %
LYMPHS PCT: 21 %
Lymphs Abs: 1.8 10*3/uL (ref 0.7–4.0)
Monocytes Absolute: 0.6 10*3/uL (ref 0.1–1.0)
Monocytes Relative: 7 %
NEUTROS ABS: 6.2 10*3/uL (ref 1.7–7.7)
NEUTROS PCT: 70 %

## 2016-06-19 LAB — HEPATIC FUNCTION PANEL
ALBUMIN: 4.4 g/dL (ref 3.5–5.0)
ALK PHOS: 56 U/L (ref 38–126)
ALT: 39 U/L (ref 17–63)
AST: 35 U/L (ref 15–41)
Bilirubin, Direct: 0.2 mg/dL (ref 0.1–0.5)
Indirect Bilirubin: 0.7 mg/dL (ref 0.3–0.9)
TOTAL PROTEIN: 8.1 g/dL (ref 6.5–8.1)
Total Bilirubin: 0.9 mg/dL (ref 0.3–1.2)

## 2016-06-19 LAB — BASIC METABOLIC PANEL
Anion gap: 7 (ref 5–15)
BUN: 20 mg/dL (ref 6–20)
CALCIUM: 9.3 mg/dL (ref 8.9–10.3)
CHLORIDE: 105 mmol/L (ref 101–111)
CO2: 26 mmol/L (ref 22–32)
CREATININE: 1.07 mg/dL (ref 0.61–1.24)
Glucose, Bld: 112 mg/dL — ABNORMAL HIGH (ref 65–99)
Potassium: 4.1 mmol/L (ref 3.5–5.1)
SODIUM: 138 mmol/L (ref 135–145)

## 2016-06-19 LAB — CBC
HCT: 43.8 % (ref 39.0–52.0)
Hemoglobin: 15.1 g/dL (ref 13.0–17.0)
MCH: 29.3 pg (ref 26.0–34.0)
MCHC: 34.5 g/dL (ref 30.0–36.0)
MCV: 85 fL (ref 78.0–100.0)
PLATELETS: 246 10*3/uL (ref 150–400)
RBC: 5.15 MIL/uL (ref 4.22–5.81)
RDW: 12.6 % (ref 11.5–15.5)
WBC: 8.2 10*3/uL (ref 4.0–10.5)

## 2016-06-20 LAB — HEMOGLOBIN A1C
Hgb A1c MFr Bld: 5.9 % — ABNORMAL HIGH (ref 4.8–5.6)
MEAN PLASMA GLUCOSE: 123 mg/dL

## 2016-06-24 ENCOUNTER — Ambulatory Visit: Payer: Self-pay | Admitting: Surgery

## 2016-06-24 NOTE — H&P (Signed)
Brandon Bailey 05/08/2016 9:42 AM Location: [facility] Patient #: [id number] DOB: Jun 07, 1970 Married / Language: English / Race: White Male   History of Present Illness Brandon Bailey Done MD; 05/08/2016 11:01 AM) The patient is a 46 year old male who presents for a bariatric surgery evaluation. Mr. and [name] came in to discuss the pathway to sleeve gastrectomy. He was seen and evaluated by me 2 years ago and we had gotten informed consent regarding a laparoscopic vertical sleeve gastrectomy. He was very comfortable with that operation. He has been followed by Dr. Christie Nottingham and has weights going all the way back to November 2011 and his BMI has been generally well above 40. His lowest was in 2011 when his BMI was 38.26 but he has been consistently above that since then. He currently has prediabetes with an elevated hemoglobin A1c and he has obstructive sleep apnea requiring the use of CPAP. He has been followed by Dr. Sherren Mocha and numerous weight loss attempts have been tried without sustained results and for that reason Dr. Sherren Mocha and referred him to [place]. He does not have GERD. We will not need to necessarily repeat an upper GI series on him. However we will repeat whatever his insurance carrier requires successfully take him to sleeve gastrectomy this time. Currently his BMI is 42.9 with a weight of 334.  Preop March 20 and all questions are answered.     Past Surgical History Brandon Bailey, CMA; 05/08/2016 9:44 AM) Oral Surgery  Vasectomy   Diagnostic Studies History Brandon Bailey, CMA; 05/08/2016 9:44 AM) Colonoscopy  never  Allergies Brandon Bailey, CMA; 05/08/2016 9:43 AM) No Known Drug Allergies 07/12/2014  Medication History Brandon Bailey, CMA; 05/08/2016 9:43 AM) No Current Medications  Social History Brandon Bailey, CMA; 05/08/2016 9:44 AM) Caffeine use  Carbonated beverages, Coffee, Tea. No alcohol use  No drug use  Tobacco use  Never smoker.  Family  History Brandon Bailey, CMA; 05/08/2016 9:44 AM) Breast Cancer  Family Members In General. Cerebrovascular Accident  Family Members In General. Hypertension  Father. Melanoma  Father. Migraine Headache  Mother.  Other Problems Brandon Bailey, CMA; 05/08/2016 9:44 AM) Sleep Apnea     Review of Systems (Chemira Jones CMA; 05/08/2016 9:44 AM) General Present- Weight Gain. Not Present- Appetite Loss, Chills, Fatigue, Fever, Night Sweats and Weight Loss. Skin Not Present- Change in Wart/Mole, Dryness, Hives, Jaundice, New Lesions, Non-Healing Wounds, Rash and Ulcer. HEENT Present- Seasonal Allergies and Wears glasses/contact lenses. Not Present- Earache, Hearing Loss, Hoarseness, Nose Bleed, Oral Ulcers, Ringing in the Ears, Sinus Pain, Sore Throat, Visual Disturbances and Yellow Eyes. Respiratory Present- Snoring. Not Present- Bloody sputum, Chronic Cough, Difficulty Breathing and Wheezing. Breast Not Present- Breast Mass, Breast Pain, Nipple Discharge and Skin Changes. Cardiovascular Present- Shortness of Breath and Swelling of Extremities. Not Present- Chest Pain, Difficulty Breathing Lying Down, Leg Cramps, Palpitations and Rapid Heart Rate. Gastrointestinal Not Present- Abdominal Pain, Bloating, Bloody Stool, Change in Bowel Habits, Chronic diarrhea, Constipation, Difficulty Swallowing, Excessive gas, Gets full quickly at meals, Hemorrhoids, Indigestion, Nausea, Rectal Pain and Vomiting. Male Genitourinary Not Present- Blood in Urine, Change in Urinary Stream, Frequency, Impotence, Nocturia, Painful Urination, Urgency and Urine Leakage. Musculoskeletal Not Present- Back Pain, Joint Pain, Joint Stiffness, Muscle Pain, Muscle Weakness and Swelling of Extremities. Neurological Present- Headaches. Not Present- Decreased Memory, Fainting, Numbness, Seizures, Tingling, Tremor, Trouble walking and Weakness. Psychiatric Not Present- Anxiety, Bipolar, Change in Sleep Pattern, Depression, Fearful and  Frequent crying. Endocrine Not Present-  Cold Intolerance, Excessive Hunger, Hair Changes, Heat Intolerance, Hot flashes and New Diabetes. Hematology Not Present- Blood Thinners, Easy Bruising, Excessive bleeding, Gland problems, HIV and Persistent Infections.  Vitals (Chemira Jones CMA; 05/08/2016 9:43 AM) 05/08/2016 9:42 AM Weight: 334.6 lb Height: 74in Body Surface Area: 2.7 m Body Mass Index: 42.96 kg/m  Temp.: 98.20F(Oral)  Pulse: 87 (Regular)  BP: 130/84 (Sitting, Left Arm, Standard)  Physical Exam (Merilee Wible B. Bailey Done MD; 05/08/2016 10:58 AM) The physical exam findings are as follows: Note:HEENT unremarkable except for glasses Neck supple Chest clear Heart SR without murmurs Abdomen is obese without tenderness Ext FROM Neuro alert and oriented x 3 with normal motor and sensory function.    Assessment & Plan Brandon Bailey Done MD; 05/08/2016 10:59 AM) MORBID OBESITY (E66.01) Sleeve gastrectomy   Brandon Bailey Done, MD, FACS

## 2016-06-26 MED FILL — oxyCODONE HCL 5 MG/5ML SOLN: 5 | 1 days supply | Qty: 100 | Fill #0

## 2016-06-27 NOTE — Progress Notes (Signed)
Spoke with pt by phone aware surgery time changed to 1130 am arrive 930 am 06-30-16 wl short stay npo after midnight

## 2016-06-30 ENCOUNTER — Inpatient Hospital Stay (HOSPITAL_COMMUNITY): Payer: 59 | Admitting: Certified Registered Nurse Anesthetist

## 2016-06-30 ENCOUNTER — Encounter (HOSPITAL_COMMUNITY)
Admission: RE | Disposition: A | Discharge status: Home / Self Care | Payer: Self-pay | Source: Ambulatory Visit | Attending: Surgery

## 2016-06-30 ENCOUNTER — Inpatient Hospital Stay (HOSPITAL_COMMUNITY)
Admission: RE | Admit: 2016-06-30 | Discharge: 2016-07-02 | DRG: 621 | Disposition: A | Discharge status: Home / Self Care | Payer: 59 | Source: Ambulatory Visit | Attending: Surgery | Admitting: Surgery

## 2016-06-30 ENCOUNTER — Encounter (HOSPITAL_COMMUNITY): Payer: Self-pay | Admitting: *Deleted

## 2016-06-30 DIAGNOSIS — Z6841 Body Mass Index (BMI) 40.0 and over, adult: Secondary | ICD-10-CM | POA: Diagnosis not present

## 2016-06-30 DIAGNOSIS — G4733 Obstructive sleep apnea (adult) (pediatric): Secondary | ICD-10-CM | POA: Diagnosis not present

## 2016-06-30 DIAGNOSIS — E785 Hyperlipidemia, unspecified: Secondary | ICD-10-CM | POA: Diagnosis not present

## 2016-06-30 DIAGNOSIS — R7303 Prediabetes: Secondary | ICD-10-CM | POA: Diagnosis present

## 2016-06-30 DIAGNOSIS — Z9884 Bariatric surgery status: Secondary | ICD-10-CM

## 2016-06-30 HISTORY — PX: LAPAROSCOPIC GASTRIC SLEEVE RESECTION: SHX5895

## 2016-06-30 LAB — CBC
HCT: 43 % (ref 39.0–52.0)
Hemoglobin: 14.5 g/dL (ref 13.0–17.0)
MCH: 29.2 pg (ref 26.0–34.0)
MCHC: 33.7 g/dL (ref 30.0–36.0)
MCV: 86.7 fL (ref 78.0–100.0)
PLATELETS: 226 10*3/uL (ref 150–400)
RBC: 4.96 MIL/uL (ref 4.22–5.81)
RDW: 12.9 % (ref 11.5–15.5)
WBC: 12.3 10*3/uL — ABNORMAL HIGH (ref 4.0–10.5)

## 2016-06-30 LAB — CREATININE, SERUM
Creatinine, Ser: 1.01 mg/dL (ref 0.61–1.24)
GFR calc Af Amer: >60 mL/min (ref >60)
GFR calc non Af Amer: >60 mL/min (ref >60)

## 2016-06-30 SURGERY — GASTRECTOMY, SLEEVE, LAPAROSCOPIC
Anesthesia: General

## 2016-06-30 MED ORDER — HYDROMORPHONE HCL 1 MG/ML IJ SOLN
0.2500 mg | INTRAMUSCULAR | Status: DC | PRN
  Administered 2016-06-30 (×4): 0.5 mg via INTRAVENOUS

## 2016-06-30 MED ORDER — HYDROMORPHONE HCL 1 MG/ML IJ SOLN
INTRAMUSCULAR | Status: AC
  Filled 2016-06-30: qty 1

## 2016-06-30 MED ORDER — SUGAMMADEX SODIUM 200 MG/2ML IV SOLN
INTRAVENOUS | Status: DC | PRN
  Administered 2016-06-30: 300 mg via INTRAVENOUS

## 2016-06-30 MED ORDER — MIDAZOLAM HCL 5 MG/5ML IJ SOLN
INTRAMUSCULAR | Status: DC | PRN
  Administered 2016-06-30: 2 mg via INTRAVENOUS

## 2016-06-30 MED ORDER — OXYCODONE HCL 5 MG/5ML PO SOLN
5.0000 mg | Freq: Once | ORAL | Status: DC | PRN
  Filled 2016-06-30: qty 5

## 2016-06-30 MED ORDER — MIDAZOLAM HCL 2 MG/2ML IJ SOLN
INTRAMUSCULAR | Status: AC
  Filled 2016-06-30: qty 2

## 2016-06-30 MED ORDER — ROCURONIUM BROMIDE 50 MG/5ML IV SOSY
PREFILLED_SYRINGE | INTRAVENOUS | Status: AC
  Filled 2016-06-30: qty 5

## 2016-06-30 MED ORDER — ACETAMINOPHEN 325 MG PO TABS: 650.0000 mg | ORAL_TABLET | ORAL | Status: DC | PRN

## 2016-06-30 MED ORDER — MEPERIDINE HCL 50 MG/ML IJ SOLN: 6.2500 mg | INTRAMUSCULAR | Status: DC | PRN

## 2016-06-30 MED ORDER — PHENYLEPHRINE 40 MCG/ML (10ML) SYRINGE FOR IV PUSH (FOR BLOOD PRESSURE SUPPORT)
PREFILLED_SYRINGE | INTRAVENOUS | Status: AC
  Filled 2016-06-30: qty 10

## 2016-06-30 MED ORDER — EPHEDRINE SULFATE 50 MG/ML IJ SOLN
INTRAMUSCULAR | Status: DC | PRN
  Administered 2016-06-30 (×2): 10 mg via INTRAVENOUS

## 2016-06-30 MED ORDER — CEFOTETAN DISODIUM-DEXTROSE 2-2.08 GM-% IV SOLR
2.0000 g | INTRAVENOUS | Status: AC
  Administered 2016-06-30: 2 g via INTRAVENOUS
  Filled 2016-06-30: qty 50

## 2016-06-30 MED ORDER — DEXAMETHASONE SODIUM PHOSPHATE 10 MG/ML IJ SOLN
INTRAMUSCULAR | Status: AC
  Filled 2016-06-30: qty 1

## 2016-06-30 MED ORDER — SUGAMMADEX SODIUM 500 MG/5ML IV SOLN
INTRAVENOUS | Status: AC
  Filled 2016-06-30: qty 5

## 2016-06-30 MED ORDER — HEPARIN SODIUM (PORCINE) 5000 UNIT/ML IJ SOLN
5000.0000 [IU] | Freq: Three times a day (TID) | INTRAMUSCULAR | Status: DC
Start: 2016-06-30 — End: 2016-07-02
  Administered 2016-06-30 – 2016-07-02 (×5): 5000 [IU] via SUBCUTANEOUS
  Filled 2016-06-30 (×5): qty 1

## 2016-06-30 MED ORDER — OXYCODONE HCL 5 MG/5ML PO SOLN
5.0000 mg | ORAL | Status: DC | PRN
Start: 2016-06-30 — End: 2016-07-02
  Administered 2016-07-01 – 2016-07-02 (×3): 10 mg via ORAL
  Filled 2016-06-30 (×3): qty 10

## 2016-06-30 MED ORDER — PREMIER PROTEIN SHAKE
2.0000 [oz_av] | ORAL | Status: DC
  Administered 2016-07-01 (×6): 2 [oz_av] via ORAL
  Filled 2016-06-30 (×11): qty 325.31

## 2016-06-30 MED ORDER — SODIUM CHLORIDE 0.9 % IJ SOLN
INTRAMUSCULAR | Status: DC | PRN
Start: 2016-06-30 — End: 2016-06-30
  Administered 2016-06-30: 10 mL

## 2016-06-30 MED ORDER — LIDOCAINE 2% (20 MG/ML) 5 ML SYRINGE
INTRAMUSCULAR | Status: AC
  Filled 2016-06-30: qty 5

## 2016-06-30 MED ORDER — BUPIVACAINE LIPOSOME 1.3 % IJ SUSP
20.0000 mL | Freq: Once | INTRAMUSCULAR | Status: AC
  Administered 2016-06-30: 20 mL
  Filled 2016-06-30: qty 20

## 2016-06-30 MED ORDER — MORPHINE SULFATE (PF) 4 MG/ML IV SOLN
1.0000 mg | INTRAVENOUS | Status: DC | PRN
  Administered 2016-06-30: 3 mg via INTRAVENOUS
  Administered 2016-06-30: 2 mg via INTRAVENOUS
  Administered 2016-07-01 (×2): 3 mg via INTRAVENOUS
  Filled 2016-06-30 (×4): qty 1

## 2016-06-30 MED ORDER — ONDANSETRON HCL 4 MG/2ML IJ SOLN
INTRAMUSCULAR | Status: AC
  Filled 2016-06-30: qty 2

## 2016-06-30 MED ORDER — 0.9 % SODIUM CHLORIDE (POUR BTL) OPTIME
TOPICAL | Status: DC | PRN
  Administered 2016-06-30: 1000 mL

## 2016-06-30 MED ORDER — FENTANYL CITRATE (PF) 100 MCG/2ML IJ SOLN
INTRAMUSCULAR | Status: DC | PRN
  Administered 2016-06-30 (×5): 50 ug via INTRAVENOUS

## 2016-06-30 MED ORDER — TISSEEL VH 10 ML EX KIT
PACK | CUTANEOUS | Status: AC
  Filled 2016-06-30: qty 1

## 2016-06-30 MED ORDER — CHLORHEXIDINE GLUCONATE CLOTH 2 % EX PADS: 6.0000 | MEDICATED_PAD | Freq: Once | CUTANEOUS | Status: DC

## 2016-06-30 MED ORDER — ONDANSETRON HCL 4 MG/2ML IJ SOLN: 4.0000 mg | INTRAMUSCULAR | Status: DC | PRN

## 2016-06-30 MED ORDER — LACTATED RINGERS IV SOLN
INTRAVENOUS | Status: DC
  Administered 2016-06-30 (×2): via INTRAVENOUS
  Administered 2016-06-30: 1000 mL via INTRAVENOUS

## 2016-06-30 MED ORDER — ACETAMINOPHEN 160 MG/5ML PO SOLN: 325.0000 mg | ORAL | Status: DC | PRN

## 2016-06-30 MED ORDER — LIDOCAINE 2% (20 MG/ML) 5 ML SYRINGE
INTRAMUSCULAR | Status: DC | PRN
  Administered 2016-06-30: 100 mg via INTRAVENOUS

## 2016-06-30 MED ORDER — FENTANYL CITRATE (PF) 250 MCG/5ML IJ SOLN
INTRAMUSCULAR | Status: AC
  Filled 2016-06-30: qty 5

## 2016-06-30 MED ORDER — PROPOFOL 10 MG/ML IV BOLUS
INTRAVENOUS | Status: DC | PRN
  Administered 2016-06-30: 300 mg via INTRAVENOUS

## 2016-06-30 MED ORDER — PROMETHAZINE HCL 25 MG/ML IJ SOLN
6.2500 mg | INTRAMUSCULAR | Status: DC | PRN
  Administered 2016-06-30: 12.5 mg via INTRAVENOUS

## 2016-06-30 MED ORDER — OXYCODONE HCL 5 MG PO TABS: 5.0000 mg | ORAL_TABLET | Freq: Once | ORAL | Status: DC | PRN

## 2016-06-30 MED ORDER — HEPARIN SODIUM (PORCINE) 5000 UNIT/ML IJ SOLN
5000.0000 [IU] | INTRAMUSCULAR | Status: AC
  Administered 2016-06-30: 5000 [IU] via SUBCUTANEOUS
  Filled 2016-06-30: qty 1

## 2016-06-30 MED ORDER — LACTATED RINGERS IR SOLN
Status: DC | PRN
  Administered 2016-06-30: 1000 mL

## 2016-06-30 MED ORDER — PHENYLEPHRINE HCL 10 MG/ML IJ SOLN
INTRAMUSCULAR | Status: DC | PRN
  Administered 2016-06-30 (×3): 80 ug via INTRAVENOUS

## 2016-06-30 MED ORDER — SODIUM CHLORIDE 0.9 % IJ SOLN
INTRAMUSCULAR | Status: AC
  Filled 2016-06-30: qty 10

## 2016-06-30 MED ORDER — PROPOFOL 10 MG/ML IV BOLUS
INTRAVENOUS | Status: AC
  Filled 2016-06-30: qty 40

## 2016-06-30 MED ORDER — SUCCINYLCHOLINE CHLORIDE 200 MG/10ML IV SOSY
PREFILLED_SYRINGE | INTRAVENOUS | Status: DC | PRN
  Administered 2016-06-30: 120 mg via INTRAVENOUS

## 2016-06-30 MED ORDER — PROMETHAZINE HCL 25 MG/ML IJ SOLN
INTRAMUSCULAR | Status: AC
  Filled 2016-06-30: qty 1

## 2016-06-30 MED ORDER — SUCCINYLCHOLINE CHLORIDE 200 MG/10ML IV SOSY
PREFILLED_SYRINGE | INTRAVENOUS | Status: AC
  Filled 2016-06-30: qty 10

## 2016-06-30 MED ORDER — ONDANSETRON HCL 4 MG/2ML IJ SOLN
INTRAMUSCULAR | Status: DC | PRN
  Administered 2016-06-30: 4 mg via INTRAVENOUS

## 2016-06-30 MED ORDER — PANTOPRAZOLE SODIUM 40 MG IV SOLR
40.0000 mg | Freq: Every day | INTRAVENOUS | Status: DC
  Administered 2016-06-30 – 2016-07-01 (×2): 40 mg via INTRAVENOUS
  Filled 2016-06-30 (×4): qty 40

## 2016-06-30 MED ORDER — ROCURONIUM BROMIDE 50 MG/5ML IV SOSY
PREFILLED_SYRINGE | INTRAVENOUS | Status: DC | PRN
  Administered 2016-06-30: 10 mg via INTRAVENOUS
  Administered 2016-06-30: 60 mg via INTRAVENOUS
  Administered 2016-06-30 (×2): 10 mg via INTRAVENOUS

## 2016-06-30 MED ORDER — KCL IN DEXTROSE-NACL 20-5-0.45 MEQ/L-%-% IV SOLN
INTRAVENOUS | Status: DC
  Administered 2016-06-30 – 2016-07-01 (×2): via INTRAVENOUS
  Administered 2016-07-02: 75 mL/h via INTRAVENOUS
  Filled 2016-06-30 (×4): qty 1000

## 2016-06-30 MED ORDER — APREPITANT 40 MG PO CAPS
40.0000 mg | ORAL_CAPSULE | ORAL | Status: AC
  Administered 2016-06-30: 40 mg via ORAL
  Filled 2016-06-30: qty 1

## 2016-06-30 MED ORDER — DEXAMETHASONE SODIUM PHOSPHATE 10 MG/ML IJ SOLN
INTRAMUSCULAR | Status: DC | PRN
  Administered 2016-06-30: 10 mg via INTRAVENOUS

## 2016-06-30 MED ORDER — EPHEDRINE 5 MG/ML INJ
INTRAVENOUS | Status: AC
  Filled 2016-06-30: qty 10

## 2016-06-30 SURGICAL SUPPLY — 68 items
ADH SKN CLS APL DERMABOND .7 (GAUZE/BANDAGES/DRESSINGS)
APPLICATOR COTTON TIP 6IN STRL (MISCELLANEOUS) IMPLANT
APPLIER CLIP 5 13 M/L LIGAMAX5 (MISCELLANEOUS)
APPLIER CLIP ROT 10 11.4 M/L (STAPLE)
APPLIER CLIP ROT 13.4 12 LRG (CLIP)
APR CLP LRG 13.4X12 ROT 20 MLT (CLIP)
APR CLP MED LRG 11.4X10 (STAPLE)
APR CLP MED LRG 5 ANG JAW (MISCELLANEOUS)
BLADE SURG 15 STRL LF DISP TIS (BLADE) ×1 IMPLANT
BLADE SURG 15 STRL SS (BLADE) ×3
CABLE HIGH FREQUENCY MONO STRZ (ELECTRODE) ×3 IMPLANT
CLIP APPLIE 5 13 M/L LIGAMAX5 (MISCELLANEOUS) IMPLANT
CLIP APPLIE ROT 10 11.4 M/L (STAPLE) IMPLANT
CLIP APPLIE ROT 13.4 12 LRG (CLIP) IMPLANT
COVER SURGICAL LIGHT HANDLE (MISCELLANEOUS) ×3 IMPLANT
DERMABOND ADVANCED (GAUZE/BANDAGES/DRESSINGS)
DERMABOND ADVANCED .7 DNX12 (GAUZE/BANDAGES/DRESSINGS) IMPLANT
DEVICE SUT QUICK LOAD TK 5 (STAPLE) IMPLANT
DEVICE SUT TI-KNOT TK 5X26 (MISCELLANEOUS) IMPLANT
DEVICE SUTURE ENDOST 10MM (ENDOMECHANICALS) IMPLANT
DEVICE TI KNOT TK5 (MISCELLANEOUS)
DEVICE TROCAR PUNCTURE CLOSURE (ENDOMECHANICALS) ×3 IMPLANT
DISSECTOR BLUNT TIP ENDO 5MM (MISCELLANEOUS) IMPLANT
ELECT REM PT RETURN 15FT ADLT (MISCELLANEOUS) ×3 IMPLANT
GAUZE SPONGE 4X4 12PLY STRL (GAUZE/BANDAGES/DRESSINGS) IMPLANT
GLOVE BIOGEL M 8.0 STRL (GLOVE) ×3 IMPLANT
GOWN STRL REUS W/TWL XL LVL3 (GOWN DISPOSABLE) ×12 IMPLANT
HANDLE STAPLE EGIA 4 XL (STAPLE) ×3 IMPLANT
HOVERMATT SINGLE USE (MISCELLANEOUS) ×3 IMPLANT
IRRIG SUCT STRYKERFLOW 2 WTIP (MISCELLANEOUS) ×3
IRRIGATION SUCT STRKRFLW 2 WTP (MISCELLANEOUS) ×1 IMPLANT
KIT BASIN OR (CUSTOM PROCEDURE TRAY) ×3 IMPLANT
LIQUID BAND (GAUZE/BANDAGES/DRESSINGS) ×2 IMPLANT
MARKER SKIN DUAL TIP RULER LAB (MISCELLANEOUS) ×3 IMPLANT
NDL SPNL 22GX3.5 QUINCKE BK (NEEDLE) ×1 IMPLANT
NEEDLE SPNL 22GX3.5 QUINCKE BK (NEEDLE) ×3 IMPLANT
PACK UNIVERSAL I (CUSTOM PROCEDURE TRAY) ×3 IMPLANT
QUICK LOAD TK 5 (STAPLE)
RELOAD STAPLE 45 PURP MED/THCK (STAPLE) IMPLANT
RELOAD TRI 45 ART MED THCK BLK (STAPLE) ×3 IMPLANT
RELOAD TRI 45 ART MED THCK PUR (STAPLE) ×3 IMPLANT
RELOAD TRI 60 ART MED THCK BLK (STAPLE) ×3 IMPLANT
RELOAD TRI 60 ART MED THCK PUR (STAPLE) ×6 IMPLANT
SCISSORS LAP 5X45 EPIX DISP (ENDOMECHANICALS) IMPLANT
SHEARS HARMONIC ACE PLUS 45CM (MISCELLANEOUS) ×3 IMPLANT
SLEEVE ADV FIXATION 5X100MM (TROCAR) ×6 IMPLANT
SLEEVE GASTRECTOMY 36FR VISIGI (MISCELLANEOUS) ×3 IMPLANT
SOLUTION ANTI FOG 6CC (MISCELLANEOUS) ×3 IMPLANT
SPONGE LAP 18X18 X RAY DECT (DISPOSABLE) ×3 IMPLANT
STAPLER VISISTAT 35W (STAPLE) ×3 IMPLANT
SUT SURGIDAC NAB ES-9 0 48 120 (SUTURE) IMPLANT
SUT VIC AB 4-0 SH 18 (SUTURE) ×3 IMPLANT
SUT VICRYL 0 TIES 12 18 (SUTURE) ×3 IMPLANT
SYR 10ML ECCENTRIC (SYRINGE) ×3 IMPLANT
SYR 20CC LL (SYRINGE) ×3 IMPLANT
SYR 50ML LL SCALE MARK (SYRINGE) ×3 IMPLANT
TOWEL OR 17X26 10 PK STRL BLUE (TOWEL DISPOSABLE) ×6 IMPLANT
TOWEL OR NON WOVEN STRL DISP B (DISPOSABLE) ×3 IMPLANT
TRAY FOLEY W/METER SILVER 16FR (SET/KITS/TRAYS/PACK) IMPLANT
TROCAR ADV FIXATION 12X100MM (TROCAR) ×3 IMPLANT
TROCAR ADV FIXATION 5X100MM (TROCAR) ×3 IMPLANT
TROCAR BLADELESS 15MM (ENDOMECHANICALS) ×3 IMPLANT
TROCAR BLADELESS OPT 5 100 (ENDOMECHANICALS) ×3 IMPLANT
TUBE CALIBRATION LAPBAND (TUBING) IMPLANT
TUBING CONNECTING 10 (TUBING) ×2 IMPLANT
TUBING CONNECTING 10' (TUBING) ×1
TUBING ENDO SMARTCAP (MISCELLANEOUS) ×3 IMPLANT
TUBING INSUF HEATED (TUBING) ×3 IMPLANT

## 2016-06-30 NOTE — Anesthesia Procedure Notes (Signed)
Procedure Name: Intubation Date/Time: 06/30/2016 12:04 PM Performed by: Maxwell Caul Pre-anesthesia Checklist: Patient identified, Emergency Drugs available, Suction available and Patient being monitored Patient Re-evaluated:Patient Re-evaluated prior to inductionOxygen Delivery Method: Circle system utilized Preoxygenation: Pre-oxygenation with 100% oxygen Intubation Type: IV induction Ventilation: Mask ventilation without difficulty and Oral airway inserted - appropriate to patient size Laryngoscope Size: Mac and 4 Grade View: Grade II Tube type: Oral Number of attempts: 1 Airway Equipment and Method: Stylet and Oral airway Placement Confirmation: ETT inserted through vocal cords under direct vision,  positive ETCO2 and breath sounds checked- equal and bilateral Secured at: 21 cm Tube secured with: Tape Dental Injury: Teeth and Oropharynx as per pre-operative assessment

## 2016-06-30 NOTE — Interval H&P Note (Signed)
History and Physical Interval Note:  06/30/2016 11:29 AM  Brandon Bailey  has presented today for surgery, with the diagnosis of MORBID OBESITY  The various methods of treatment have been discussed with the patient and family. After consideration of risks, benefits and other options for treatment, the patient has consented to  Procedure(s): LAPAROSCOPIC GASTRIC SLEEVE RESECTION WITH UPPER ENDO (N/A) as a surgical intervention .  The patient's history has been reviewed, patient examined, no change in status, stable for surgery.  I have reviewed the patient's chart and labs.  Questions were answered to the patient's satisfaction.     Maniya Donovan B

## 2016-06-30 NOTE — Op Note (Signed)
Surgeon: Kaylyn Lim, MD, FACS  Asst:  Adonis Housekeeper, MD, FACS  Anes:  General endotracheal  Procedure: Laparoscopic sleeve gastrectomy and upper endoscopy  Diagnosis: Morbid obesity  Complications: None noted  EBL:   minimal cc  Description of Procedure:  The patient was take to OR 1 and given general anesthesia.  The abdomen was prepped with PCMX and draped sterilely.  A timeout was performed.  Access to the abdomen was achieved with a 5 mm Optiview through the left upper quadrant.  Following insufflation, the state of the abdomen was found to be free of adhesions.  The ViSiGi 36Fr tube was inserted to deflate the stomach and was pulled back into the esophagus.    The pylorus was identified and we measured 5 cm back and marked the antrum.  At that point we began dissection to take down the greater curvature of the stomach using the Harmonic scalpel.  This dissection was taken all the way up to the left crus.  Posterior attachments of the stomach were also taken down.    The ViSiGi tube was then passed into the antrum and suction applied so that it was snug along the lessor curvature.  The "crow's foot" or incisura was identified.  The sleeve gastrectomy was begun using the [employer] stapler beginning with a 4.5 cm black load with TRS.  This was followed by a 6 cm black load with TRS and subsequent firings with purple loads with TRS.  When the sleeve was complete the tube was taken off suction and insufflated briefly.  The tube was withdrawn.  Upper endoscopy was then performed by Dr. Excell Seltzer.  No bleeding or bubbles were seen.     The specimen was extracted through the 15 trocar site. The port site was closed with a single 0 vicryl using the endoclose.    Wounds were infiltrated with Exparel and closed with 4-0 vicryl and Dermabond.    Brandon Bailey Done, Brandon Bailey, Brandon Bailey, Brandon Bailey

## 2016-06-30 NOTE — Transfer of Care (Signed)
Immediate Anesthesia Transfer of Care Note  Patient: Brandon Bailey  Procedure(s) Performed: Procedure(s): LAPAROSCOPIC GASTRIC SLEEVE RESECTION WITH UPPER ENDO (N/A)  Patient Location: PACU  Anesthesia Type:General  Level of Consciousness:  sedated, patient cooperative and responds to stimulation  Airway & Oxygen Therapy:Patient Spontanous Breathing and Patient connected to face mask oxgen  Post-op Assessment:  Report given to PACU RN and Post -op Vital signs reviewed and stable  Post vital signs:  Reviewed and stable  Last Vitals:  Vitals:   06/30/16 0927 06/30/16 1351  BP: (!) 160/92 (!) 159/84  Pulse: 92 88  Resp: 20 13  Temp: 52.1 C     Complications: No apparent anesthesia complications

## 2016-06-30 NOTE — Anesthesia Preprocedure Evaluation (Signed)
Anesthesia Evaluation  Patient identified by MRN, date of birth, ID band Patient awake    Reviewed: Allergy & Precautions, NPO status , Patient's Chart, lab work & pertinent test results  Airway Mallampati: II  TM Distance: >3 FB Neck ROM: Full    Dental no notable dental hx.    Pulmonary neg pulmonary ROS, sleep apnea and Continuous Positive Airway Pressure Ventilation ,    Pulmonary exam normal breath sounds clear to auscultation       Cardiovascular negative cardio ROS Normal cardiovascular exam Rhythm:Regular Rate:Normal     Neuro/Psych negative neurological ROS  negative psych ROS   GI/Hepatic negative GI ROS, Neg liver ROS,   Endo/Other  negative endocrine ROSMorbid obesity  Renal/GU negative Renal ROS  negative genitourinary   Musculoskeletal negative musculoskeletal ROS (+)   Abdominal (+) + obese,   Peds negative pediatric ROS (+)  Hematology negative hematology ROS (+)   Anesthesia Other Findings   Reproductive/Obstetrics negative OB ROS                             Anesthesia Physical Anesthesia Plan  ASA: III  Anesthesia Plan: General   Post-op Pain Management:    Induction: Intravenous  Airway Management Planned: Oral ETT  Additional Equipment:   Intra-op Plan:   Post-operative Plan: Extubation in OR  Informed Consent: I have reviewed the patients History and Physical, chart, labs and discussed the procedure including the risks, benefits and alternatives for the proposed anesthesia with the patient or authorized representative who has indicated his/her understanding and acceptance.   Dental advisory given  Plan Discussed with: CRNA  Anesthesia Plan Comments:         Anesthesia Quick Evaluation

## 2016-06-30 NOTE — Anesthesia Postprocedure Evaluation (Signed)
Anesthesia Post Note  Patient: Sinjin Arment  Procedure(s) Performed: Procedure(s) (LRB): LAPAROSCOPIC GASTRIC SLEEVE RESECTION WITH UPPER ENDO (N/A)  Patient location during evaluation: PACU Anesthesia Type: General Level of consciousness: awake and alert Pain management: pain level controlled Vital Signs Assessment: post-procedure vital signs reviewed and stable Respiratory status: spontaneous breathing, nonlabored ventilation, respiratory function stable and patient connected to nasal cannula oxygen Cardiovascular status: blood pressure returned to baseline and stable Postop Assessment: no signs of nausea or vomiting Anesthetic complications: no       Last Vitals:  Vitals:   06/30/16 1445 06/30/16 1500  BP: (!) 144/69 132/82  Pulse: 63 78  Resp: 12 11  Temp:      Last Pain:  Vitals:   06/30/16 1445  TempSrc:   PainSc: [place]

## 2016-06-30 NOTE — Op Note (Signed)
Procedure: Upper GI endoscopy  Description of procedure: Upper GI endoscopy is performed at the completion of laparoscopic sleeve gastrectomy by Dr.  Hassell Done  The video endoscope was introduced into the upper esophagus and then passed to the EG junction at about 40 cm. The esophagus appeared normal. The gastric sleeve was entered. The sleeve was tensely distended with air while the outlet was obstructed under saline irrigation by the operating surgeon. There was no evidence of leak. The staple line was intact and without bleeding. The scope was advanced to the antrum and pylorus visualized. There was no stricture or twisting or mucosal abnormality, and particularly no narrowing noted at the incisura.  The pouch was then desufflated and the scope withdrawn.  Edward Jolly MD, FACS  06/30/2016, 1:23 PM

## 2016-07-01 LAB — CBC WITH DIFFERENTIAL/PLATELET
BASOS ABS: 0 10*3/uL (ref 0.0–0.1)
BASOS PCT: 0 %
EOS ABS: 0 10*3/uL (ref 0.0–0.7)
Eosinophils Relative: 0 %
HCT: 40.7 % (ref 39.0–52.0)
HEMOGLOBIN: 13.8 g/dL (ref 13.0–17.0)
Lymphocytes Relative: 9 %
Lymphs Abs: 0.7 10*3/uL (ref 0.7–4.0)
MCH: 28.8 pg (ref 26.0–34.0)
MCHC: 33.9 g/dL (ref 30.0–36.0)
MCV: 85 fL (ref 78.0–100.0)
Monocytes Absolute: 0.6 10*3/uL (ref 0.1–1.0)
Monocytes Relative: 8 %
NEUTROS PCT: 83 %
Neutro Abs: 6.2 10*3/uL (ref 1.7–7.7)
Platelets: 237 10*3/uL (ref 150–400)
RBC: 4.79 MIL/uL (ref 4.22–5.81)
RDW: 12.7 % (ref 11.5–15.5)
WBC: 7.5 10*3/uL (ref 4.0–10.5)

## 2016-07-01 NOTE — Progress Notes (Addendum)
Patient with low grade fever, states he feels unwell and has become more flushed throughout the day.  Questions answered regarding lab work and vital signs.  Dr Hassell Done paged about the above as patient is concerned with discharging today.  Dr Hassell Done instructed the patient to stay overnight to continue to monitor.  Discussed with patient and bedside RN.

## 2016-07-01 NOTE — Discharge Instructions (Signed)

## 2016-07-01 NOTE — Progress Notes (Signed)
Patient alert and oriented, Post op day 1.  Provided support and encouragement.  Encouraged pulmonary toilet, ambulation and small sips of liquids.  All questions answered.  Will continue to monitor. 

## 2016-07-01 NOTE — Plan of Care (Signed)
Problem: Food- and Nutrition-Related Knowledge Deficit (NB-1.1) Goal: Nutrition education Formal process to instruct or train a patient/client in a skill or to impart knowledge to help patients/clients voluntarily manage or modify food choices and eating behavior to maintain or improve health. Outcome: Completed/Met Date Met: 07/01/16 Nutrition Education Note  Received consult for diet education per DROP protocol.   Discussed 2 week post op diet with pt. Emphasized that liquids must be non carbonated, non caffeinated, and sugar free. Fluid goals discussed. Pt to follow up with outpatient bariatric RD for further diet progression after 2 weeks. Multivitamins and minerals also reviewed. Teach back method used, pt expressed understanding, expect good compliance.   Diet: First 2 Weeks  You will see the nutritionist about two (2) weeks after your surgery. The nutritionist will increase the types of foods you can eat if you are handling liquids well:  If you have severe vomiting or nausea and cannot handle clear liquids lasting longer than 1 day, call your surgeon  Protein Shake  Drink at least 2 ounces of shake 5-6 times per day  Each serving of protein shakes (usually 8 - 12 ounces) should have a minimum of:  15 grams of protein  And no more than 5 grams of carbohydrate  Goal for protein each day:  Men = 80 grams per day  Women = 60 grams per day  Protein powder may be added to fluids such as non-fat milk or Lactaid milk or Soy milk (limit to 35 grams added protein powder per serving)   Hydration  Slowly increase the amount of water and other clear liquids as tolerated (See Acceptable Fluids)  Slowly increase the amount of protein shake as tolerated  Sip fluids slowly and throughout the day  May use sugar substitutes in small amounts (no more than 6 - 8 packets per day; i.e. Splenda)   Fluid Goal  The first goal is to drink at least 8 ounces of protein shake/drink per day (or as directed  by the nutritionist); some examples of protein shakes are Premier Protein, Syntrax Nectar, Adkins Advantage, EAS Edge HP, and Unjury. See handout from pre-op Bariatric Education Class:  Slowly increase the amount of protein shake you drink as tolerated  You may find it easier to slowly sip shakes throughout the day  It is important to get your proteins in first  Your fluid goal is to drink 64 - 100 ounces of fluid daily  It may take a few weeks to build up to this  32 oz (or more) should be clear liquids  And  32 oz (or more) should be full liquids (see below for examples)  Liquids should not contain sugar, caffeine, or carbonation   Clear Liquids:  Water or Sugar-free flavored water (i.e. Fruit H2O, Propel)  Decaffeinated coffee or tea (sugar-free)  Crystal Lite, Wyler's Lite, Minute Maid Lite  Sugar-free Jell-O  Bouillon or broth  Sugar-free Popsicle: *Less than 20 calories each; Limit 1 per day   Full Liquids:  Protein Shakes/Drinks + 2 choices per day of other full liquids  Full liquids must be:  No More Than 12 grams of Carbs per serving  No More Than 3 grams of Fat per serving  Strained low-fat cream soup  Non-Fat milk  Fat-free Lactaid Milk  Sugar-free yogurt (Dannon Lite & Fit, Greek yogurt, Oikos Zero)   Perry Molla, MS, RD, LDN Pager: [phone] After Hours Pager: [phone]     

## 2016-07-02 LAB — CBC WITH DIFFERENTIAL/PLATELET
Basophils Absolute: 0 10*3/uL (ref 0.0–0.1)
Basophils Relative: 0 %
EOS ABS: 0 10*3/uL (ref 0.0–0.7)
EOS PCT: 0 %
HCT: 41.2 % (ref 39.0–52.0)
Hemoglobin: 13.6 g/dL (ref 13.0–17.0)
LYMPHS ABS: 2.7 10*3/uL (ref 0.7–4.0)
LYMPHS PCT: 26 %
MCH: 28.8 pg (ref 26.0–34.0)
MCHC: 33 g/dL (ref 30.0–36.0)
MCV: 87.1 fL (ref 78.0–100.0)
MONO ABS: 0.9 10*3/uL (ref 0.1–1.0)
MONOS PCT: 9 %
Neutro Abs: 6.8 10*3/uL (ref 1.7–7.7)
Neutrophils Relative %: 65 %
PLATELETS: 226 10*3/uL (ref 150–400)
RBC: 4.73 MIL/uL (ref 4.22–5.81)
RDW: 12.9 % (ref 11.5–15.5)
WBC: 10.4 10*3/uL (ref 4.0–10.5)

## 2016-07-02 NOTE — Progress Notes (Signed)
Patient alert and oriented, pain is controlled. Patient is tolerating fluids, advanced to protein shake today, patient is tolerating well.  Reviewed Gastric sleeve discharge instructions with patient and patient is able to articulate understanding.  Provided information on BELT program, Support Group and [facility] outpatient pharmacy. All questions answered, will continue to monitor.  

## 2016-07-02 NOTE — Discharge Summary (Signed)
Physician Discharge Summary  Patient ID: Brandon Bailey MRN: [id number] DOB/AGE: [birth year] 46 y.o.  Admit date: 06/30/2016 Discharge date: 07/02/2016  Admission Diagnoses:  Morbid obesity  Discharge Diagnoses:  same  Principal Problem:   S/P laparoscopic sleeve gastrectomy March 2018   Surgery:  Lap sleeve gastrectomy  Discharged Condition: improved  Hospital Course:   Had surgery.  More nauseated on PD 1.  Got better and was taking full liquids on PD 2 and ready for discharge  Consults: none  Significant Diagnostic Studies: none    Discharge Exam: Blood pressure 130/78, pulse 67, temperature 99.1 F (37.3 C), temperature source Oral, resp. rate 16, height 6\' 2"  (1.88 m), weight (!) 146.7 kg (323 lb 6 oz), SpO2 98 %. Incision OK.    Disposition: 01-Home or Self Care  Discharge Instructions    Ambulate hourly while awake    Complete by:  As directed    Call MD for:  difficulty breathing, headache or visual disturbances    Complete by:  As directed    Call MD for:  persistant dizziness or light-headedness    Complete by:  As directed    Call MD for:  persistant nausea and vomiting    Complete by:  As directed    Call MD for:  redness, tenderness, or signs of infection (pain, swelling, redness, odor or green/yellow discharge around incision site)    Complete by:  As directed    Call MD for:  severe uncontrolled pain    Complete by:  As directed    Call MD for:  temperature >101 F    Complete by:  As directed    Diet bariatric full liquid    Complete by:  As directed    Discharge wound care:    Complete by:  As directed    May shower. Apply dressing to any incision that may drain.   Incentive spirometry    Complete by:  As directed    Perform hourly while awake     Allergies as of 07/02/2016   No Known Allergies     Medication List    You have not been prescribed any medications.    Follow-up Information    Pedro Earls, MD. Go on 07/24/2016.    Specialty:  General Surgery Why:  at [address] information: [address] [place] [address] [phone]        Wendy Hoback B, MD Follow up.   Specialty:  General Surgery Contact information: [place]  [id number] [phone]           Signed: Pedro Earls 07/02/2016, 8:34 AM

## 2016-07-07 ENCOUNTER — Ambulatory Visit: Payer: 59 | Admitting: Skilled Nursing Facility1

## 2016-07-15 ENCOUNTER — Telehealth (HOSPITAL_COMMUNITY): Payer: Self-pay

## 2016-07-15 ENCOUNTER — Encounter: Payer: 59 | Attending: Surgery | Admitting: Skilled Nursing Facility1

## 2016-07-15 DIAGNOSIS — Z713 Dietary counseling and surveillance: Secondary | ICD-10-CM | POA: Insufficient documentation

## 2016-07-15 DIAGNOSIS — IMO0001 Reserved for inherently not codable concepts without codable children: Secondary | ICD-10-CM

## 2016-07-15 DIAGNOSIS — Z6839 Body mass index (BMI) 39.0-39.9, adult: Secondary | ICD-10-CM | POA: Insufficient documentation

## 2016-07-16 ENCOUNTER — Encounter: Payer: Self-pay | Admitting: Skilled Nursing Facility1

## 2016-07-16 NOTE — Progress Notes (Signed)
Bariatric Class:  Appt start time: 1530 end time:  1630.  2 Week Post-Operative Nutrition Class  Patient was seen on 07/15/2016 for Post-Operative Nutrition education at [facility].   Surgery date: 06/30/2016 Surgery type: sleeve gastrectomy  [facility] weight at [facility]: 347.6 Weight today: 306.2 Weight change: 29  TANITA  BODY COMP RESULTS  06/17/2016 07/15/2016   BMI (kg/m^2) 43 39.3   Fat Mass (lbs) 135 109   Fat Free Mass (lbs) 200.2 197.2   Total Body Water (lbs) 149.2 141.2   The following the learning objectives were met by the patient during this course:  Identifies Phase 3A (Soft, High Proteins) Dietary Goals and will begin from 2 weeks post-operatively to 2 months post-operatively  Identifies appropriate sources of fluids and proteins   States protein recommendations and appropriate sources post-operatively  Identifies the need for appropriate texture modifications, mastication, and bite sizes when consuming solids  Identifies appropriate multivitamin and calcium sources post-operatively  Describes the need for physical activity post-operatively and will follow MD recommendations  States when to call healthcare provider regarding medication questions or post-operative complications  Handouts given during class include:  Phase 3A: Soft, High Protein Diet Handout  Follow-Up Plan: Patient will follow-up at [facility] in 6 weeks for 2 month post-op nutrition visit for diet advancement per MD.

## 2016-08-05 ENCOUNTER — Other Ambulatory Visit (INDEPENDENT_AMBULATORY_CARE_PROVIDER_SITE_OTHER): Payer: 59

## 2016-08-05 DIAGNOSIS — R7303 Prediabetes: Secondary | ICD-10-CM

## 2016-08-05 LAB — BASIC METABOLIC PANEL
BUN: 13 mg/dL (ref 6–23)
CALCIUM: 9.4 mg/dL (ref 8.4–10.5)
CO2: 29 mEq/L (ref 19–32)
Chloride: 107 mEq/L (ref 96–112)
Creatinine, Ser: 0.97 mg/dL (ref 0.40–1.50)
GFR: 88.15 mL/min (ref >60.00)
GLUCOSE: 101 mg/dL — AB (ref 70–99)
Potassium: 4 mEq/L (ref 3.5–5.1)
Sodium: 143 mEq/L (ref 135–145)

## 2016-08-05 LAB — HEMOGLOBIN A1C: Hgb A1c MFr Bld: 5.9 % (ref 4.6–6.5)

## 2016-08-27 ENCOUNTER — Ambulatory Visit: Payer: 59 | Admitting: Skilled Nursing Facility1

## 2016-09-18 ENCOUNTER — Other Ambulatory Visit (HOSPITAL_COMMUNITY): Payer: Self-pay | Admitting: Surgery

## 2016-09-18 DIAGNOSIS — R1012 Left upper quadrant pain: Secondary | ICD-10-CM

## 2016-09-18 DIAGNOSIS — R109 Unspecified abdominal pain: Secondary | ICD-10-CM | POA: Diagnosis not present

## 2016-09-19 ENCOUNTER — Ambulatory Visit (HOSPITAL_COMMUNITY)
Admission: RE | Admit: 2016-09-19 | Discharge: 2016-09-19 | Disposition: A | Discharge status: Home / Self Care | Payer: 59 | Source: Ambulatory Visit | Attending: Surgery | Admitting: Surgery

## 2016-09-19 DIAGNOSIS — Z9884 Bariatric surgery status: Secondary | ICD-10-CM | POA: Diagnosis not present

## 2016-09-19 DIAGNOSIS — R1012 Left upper quadrant pain: Secondary | ICD-10-CM | POA: Diagnosis present

## 2016-09-19 DIAGNOSIS — K219 Gastro-esophageal reflux disease without esophagitis: Secondary | ICD-10-CM | POA: Insufficient documentation

## 2016-09-22 ENCOUNTER — Encounter: Payer: Self-pay | Admitting: Family Medicine

## 2016-09-22 ENCOUNTER — Ambulatory Visit (INDEPENDENT_AMBULATORY_CARE_PROVIDER_SITE_OTHER): Payer: 59 | Admitting: Family Medicine

## 2016-09-22 VITALS — BP 118/74 | HR 77 | Temp 98.7°F | Ht 74.0 in | Wt 271.0 lb

## 2016-09-22 DIAGNOSIS — R0602 Shortness of breath: Secondary | ICD-10-CM

## 2016-09-22 DIAGNOSIS — R079 Chest pain, unspecified: Secondary | ICD-10-CM | POA: Diagnosis not present

## 2016-09-22 LAB — HEPATIC FUNCTION PANEL
ALT: 13 U/L (ref 0–53)
AST: 12 U/L (ref 0–37)
Albumin: 4.3 g/dL (ref 3.5–5.2)
Alkaline Phosphatase: 60 U/L (ref 39–117)
BILIRUBIN DIRECT: 0.1 mg/dL (ref 0.0–0.3)
Total Bilirubin: 0.6 mg/dL (ref 0.2–1.2)
Total Protein: 6.5 g/dL (ref 6.0–8.3)

## 2016-09-22 LAB — CBC WITH DIFFERENTIAL/PLATELET
BASOS PCT: 0.7 % (ref 0.0–3.0)
Basophils Absolute: 0 10*3/uL (ref 0.0–0.1)
EOS PCT: 2.6 % (ref 0.0–5.0)
Eosinophils Absolute: 0.2 10*3/uL (ref 0.0–0.7)
HCT: 41 % (ref 39.0–52.0)
HEMOGLOBIN: 13.7 g/dL (ref 13.0–17.0)
LYMPHS ABS: 1.9 10*3/uL (ref 0.7–4.0)
Lymphocytes Relative: 27 % (ref 12.0–46.0)
MCHC: 33.4 g/dL (ref 30.0–36.0)
MCV: 88.9 fl (ref 78.0–100.0)
MONOS PCT: 8.9 % (ref 3.0–12.0)
Monocytes Absolute: 0.6 10*3/uL (ref 0.1–1.0)
Neutro Abs: 4.2 10*3/uL (ref 1.4–7.7)
Neutrophils Relative %: 60.8 % (ref 43.0–77.0)
Platelets: 217 10*3/uL (ref 150.0–400.0)
RBC: 4.61 Mil/uL (ref 4.22–5.81)
RDW: 14.9 % (ref 11.5–15.5)
WBC: 7 10*3/uL (ref 4.0–10.5)

## 2016-09-22 LAB — BASIC METABOLIC PANEL
BUN: 15 mg/dL (ref 6–23)
CHLORIDE: 104 meq/L (ref 96–112)
CO2: 28 mEq/L (ref 19–32)
Calcium: 9.3 mg/dL (ref 8.4–10.5)
Creatinine, Ser: 0.82 mg/dL (ref 0.40–1.50)
GFR: 106.95 mL/min (ref >60.00)
Glucose, Bld: 86 mg/dL (ref 70–99)
POTASSIUM: 3.8 meq/L (ref 3.5–5.1)
SODIUM: 139 meq/L (ref 135–145)

## 2016-09-22 LAB — TSH: TSH: 0.86 u[IU]/mL (ref 0.35–4.50)

## 2016-09-22 NOTE — Patient Instructions (Signed)
WE NOW OFFER   [facility] FAST TRACK!!!  SAME DAY Appointments for ACUTE CARE  Such as: Sprains, Injuries, cuts, abrasions, rashes, muscle pain, joint pain, back pain Colds, flu, sore throats, headache, allergies, cough, fever  Ear pain, sinus and eye infections Abdominal pain, nausea, vomiting, diarrhea, upset stomach Animal/insect bites  3 Easy Ways to Schedule: Walk-In Scheduling Call in scheduling Mychart Sign-up: [email or web address]         

## 2016-09-22 NOTE — Progress Notes (Signed)
   Subjective:    Patient ID: Brandon Bailey, male    DOB: March 09, 1971, 46 y.o.   MRN: [id number]  HPI Here with his wife for a 4 weeks history of intermittent left sided chest pains and SOB. This is more pronounced with exertion but he may have it at rest as well. One day he was folding laundry and he had to sit down for a few minutes due to SOB. He has had some mild ankle swelling in the past but none recently. No leg swelling or pain. No cough or fever. He had a gastric sleeve surgery in March and they spoke to his surgeon, Dr. Hassell Done, who ordered an UGI series with KUB, and this was within normal limits. No nausea or sweats. No palpitations or racing of the heart. He ad an ECHO in 2016 which was unremarkable and showed an EF of 60-65%. He has a dx of prediabetes but his A1c on 08-05-16 was excellent at 5.9.     Review of Systems  Constitutional: Negative.   Respiratory: Positive for shortness of breath. Negative for cough, choking and wheezing.   Cardiovascular: Positive for chest pain. Negative for palpitations and leg swelling.  Gastrointestinal: Negative.   Neurological: Negative.        Objective:   Physical Exam  Constitutional: He is oriented to person, place, and time. He appears well-developed and well-nourished. No distress.  Neck: No thyromegaly present.  Cardiovascular: Normal rate, regular rhythm, normal heart sounds and intact distal pulses.   EKG is within normal limits   Pulmonary/Chest: Effort normal and breath sounds normal. No respiratory distress. He has no wheezes. He has no rales. He exhibits no tenderness.  Musculoskeletal: He exhibits no edema.  No tenderness in the calves   Lymphadenopathy:    He has no cervical adenopathy.  Neurological: He is alert and oriented to person, place, and time.          Assessment & Plan:  Chest pain and SOB, I am concerned about the possibility of a PE, given his recent surgery. We will get labs today and set up a chest CT  angiogram. I think angina is less likely an etiology but we will arrange for him to see Cardiology soon as well.  Alysia Penna, MD

## 2016-09-23 ENCOUNTER — Telehealth: Payer: Self-pay | Admitting: *Deleted

## 2016-09-23 ENCOUNTER — Ambulatory Visit (INDEPENDENT_AMBULATORY_CARE_PROVIDER_SITE_OTHER)
Admission: RE | Admit: 2016-09-23 | Discharge: 2016-09-23 | Disposition: A | Discharge status: Home / Self Care | Payer: 59 | Source: Ambulatory Visit | Attending: Family Medicine | Admitting: Family Medicine

## 2016-09-23 DIAGNOSIS — R079 Chest pain, unspecified: Secondary | ICD-10-CM

## 2016-09-23 DIAGNOSIS — R0602 Shortness of breath: Secondary | ICD-10-CM

## 2016-09-23 MED ORDER — IOPAMIDOL (ISOVUE-370) INJECTION 76%
100.0000 mL | Freq: Once | INTRAVENOUS | Status: AC | PRN
  Administered 2016-09-23: 100 mL via INTRAVENOUS

## 2016-09-23 NOTE — Telephone Encounter (Signed)
FYI: [place] CT called to report  that CT Angio Chest was negative; approved for patient to leave CT department.

## 2016-09-24 ENCOUNTER — Ambulatory Visit: Payer: 59 | Admitting: Cardiology

## 2016-09-29 ENCOUNTER — Encounter: Payer: Self-pay | Admitting: Cardiovascular Disease

## 2016-09-29 ENCOUNTER — Ambulatory Visit (INDEPENDENT_AMBULATORY_CARE_PROVIDER_SITE_OTHER): Payer: 59 | Admitting: Cardiovascular Disease

## 2016-09-29 VITALS — BP 128/80 | HR 75 | Ht 74.0 in | Wt 272.5 lb

## 2016-09-29 DIAGNOSIS — R079 Chest pain, unspecified: Secondary | ICD-10-CM

## 2016-09-29 NOTE — Patient Instructions (Addendum)
Medication Instructions:  Your physician recommends that you continue on your current medications as directed. Please refer to the Current Medication list given to you today.   Labwork: none  Testing/Procedures:  [place] EXERCISE STRESS MYOVIEW  Your caregiver has ordered a Stress Test with nuclear imaging. The purpose of this test is to evaluate the blood supply to your heart muscle. This procedure is referred to as a "Non-Invasive Stress Test." This is because other than having an IV started in your vein, nothing is inserted or "invades" your body. Cardiac stress tests are done to find areas of poor blood flow to the heart by determining the extent of coronary artery disease (CAD). Some patients exercise on a treadmill, which naturally increases the blood flow to your heart, while others who are  unable to walk on a treadmill due to physical limitations have a pharmacologic/chemical stress agent called Lexiscan . This medicine will mimic walking on a treadmill by temporarily increasing your coronary blood flow.   Please note: these test may take anywhere between 2-4 hours to complete  PLEASE REPORT TO [facility] AT THE FIRST DESK WILL DIRECT YOU WHERE TO GO  Date of Procedure:________06/28/18__________  Arrival Time for Procedure:______07:15 AM___________    PLEASE NOTIFY THE OFFICE AT LEAST 24 HOURS IN ADVANCE IF YOU ARE UNABLE TO KEEP YOUR APPOINTMENT.  [phone] AND  PLEASE NOTIFY NUCLEAR MEDICINE AT [facility] AT LEAST 24 HOURS IN ADVANCE IF YOU ARE UNABLE TO KEEP YOUR APPOINTMENT. [phone]  How to prepare for your Myoview test:  1. Do not eat or drink after midnight 2. No caffeine for 24 hours prior to test 3. No smoking 24 hours prior to test. 4. Your medication may be taken with water.  If your doctor stopped a medication because of this test, do not take that medication. 5. Ladies, please do not wear dresses.  Skirts or pants are appropriate.  Please wear a short sleeve shirt. 6. No perfume, cologne or lotion. 7. Wear comfortable walking shoes. No heels!    Follow-Up: Your physician recommends that you schedule a follow-up appointment in: ON AN AS NEEDED BASIS.   Cardiac Nuclear Scan A cardiac nuclear scan is a test that measures blood flow to the heart when a person is resting and when he or she is exercising. The test looks for problems such as:  Not enough blood reaching a portion of the heart.  The heart muscle not working normally.  You may need this test if:  You have heart disease.  You have had abnormal lab results.  You have had heart surgery or angioplasty.  You have chest pain.  You have shortness of breath.  In this test, a radioactive dye (tracer) is injected into your bloodstream. After the tracer has traveled to your heart, an imaging device is used to measure how much of the tracer is absorbed by or distributed to various areas of your heart. This procedure is usually done at a hospital and takes 2-4 hours. Tell a health care provider about:  Any allergies you have.  All medicines you are taking, including vitamins, herbs, eye drops, creams, and over-the-counter medicines.  Any problems you or family members have had with the use of anesthetic medicines.  Any blood disorders you have.  Any surgeries you have had.  Any medical conditions you have.  Whether you are pregnant or may be pregnant. What are the risks? Generally, this is a safe procedure. However, problems may  occur, including:  Serious chest pain and heart attack. This is only a risk if the stress portion of the test is done.  Rapid heartbeat.  Sensation of warmth in your chest. This usually passes quickly.  What happens before the procedure?  Ask your health care provider about changing or stopping your regular medicines. This is especially important if you are taking diabetes medicines or blood thinners.  Remove your  jewelry on the day of the procedure. What happens during the procedure?  An IV tube will be inserted into one of your veins.  Your health care provider will inject a small amount of radioactive tracer through the tube.  You will wait for 20-40 minutes while the tracer travels through your bloodstream.  Your heart activity will be monitored with an electrocardiogram (ECG).  You will lie down on an exam table.  Images of your heart will be taken for about 15-20 minutes.  You may be asked to exercise on a treadmill or stationary bike. While you exercise, your heart's activity will be monitored with an ECG, and your blood pressure will be checked. If you are unable to exercise, you may be given a medicine to increase blood flow to parts of your heart.  When blood flow to your heart has peaked, a tracer will again be injected through the IV tube.  After 20-40 minutes, you will get back on the exam table and have more images taken of your heart.  When the procedure is over, your IV tube will be removed. The procedure may vary among health care providers and hospitals. Depending on the type of tracer used, scans may need to be repeated 3-4 hours later. What happens after the procedure?  Unless your health care provider tells you otherwise, you may return to your normal schedule, including diet, activities, and medicines.  Unless your health care provider tells you otherwise, you may increase your fluid intake. This will help flush the contrast dye from your body. Drink enough fluid to keep your urine clear or pale yellow.  It is up to you to get your test results. Ask your health care provider, or the department that is doing the test, when your results will be ready. Summary  A cardiac nuclear scan measures the blood flow to the heart when a person is resting and when he or she is exercising.  You may need this test if you are at risk for heart disease.  Tell your health care provider  if you are pregnant.  Unless your health care provider tells you otherwise, increase your fluid intake. This will help flush the contrast dye from your body. Drink enough fluid to keep your urine clear or pale yellow. This information is not intended to replace advice given to you by your health care provider. Make sure you discuss any questions you have with your health care provider. Document Released: 04/18/2004 Document Revised: 03/26/2016 Document Reviewed: 03/02/2013 Elsevier Interactive Patient Education  2017 [employer].

## 2016-09-29 NOTE — Progress Notes (Signed)
Cardiology Office Note   Date:  09/29/2016   ID:  Karthikeya Funke, DOB [birth year], MRN [id number]  PCP:  Dorena Cookey, MD  Cardiologist:   Kathlyn Sacramento, MD   Chief Complaint  Patient presents with  . other    Ref by Dr. Sarajane Jews for chest pain. Pt. c/o chest pain and shortness of breath this past Saturday. Meds reviewed by the pt. verbally.       History of Present Illness: Brandon Bailey is a 46 y.o. male who was referred by Dr. Sarajane Jews for evaluation of chest pain. He has no previous cardiac history. He had an echocardiogram in October, 2016 which showed normal LV systolic function, mild valvular abnormalities and no evidence of pulmonary hypertension. Other than obesity, he does not have significant chronic medical conditions. There is family history of coronary artery disease but not prematurely. He is not a smoker. He had recent left sided chest pain described as sharp discomfort with no radiation. This usually last for a few minutes and it happens mostly with activities. He reports chronic exertional dyspnea. No heartburn or palpitations. He had gastric sleeve surgery in March. UGI series with KUB were unremarkable.  CTA of chest showed no pulmonary embolism but there was aortic atherosclerosis.     Past Medical History:  Diagnosis Date  . High cholesterol   . Pre-diabetes     Past Surgical History:  Procedure Laterality Date  . BREATH TEK H PYLORI N/A 08/09/2014   Procedure: BREATH TEK H PYLORI;  Surgeon: Johnathan Hausen, MD;  Location: [facility];  Service: General;  Laterality: N/A;  . LAPAROSCOPIC GASTRIC SLEEVE RESECTION N/A 06/30/2016   Procedure: LAPAROSCOPIC GASTRIC SLEEVE RESECTION WITH UPPER ENDO;  Surgeon: Johnathan Hausen, MD;  Location: [facility];  Service: General;  Laterality: N/A;     Current Outpatient Prescriptions  Medication Sig Dispense Refill  . pantoprazole (PROTONIX) 40 MG tablet Take 40 mg by mouth daily.    Marland Kitchen VITAMIN D, ERGOCALCIFEROL, PO Take  5,000 Units by mouth daily.     No current facility-administered medications for this visit.     Allergies:   Patient has no known allergies.    Social History:  The patient  reports that he has never smoked. He has never used smokeless tobacco. He reports that he does not drink alcohol or use drugs.   Family History:  The patient's family history includes Allergies in his father; Cancer in his father; Heart disease in his paternal grandfather and paternal grandmother.    ROS:  Please see the history of present illness.   Otherwise, review of systems are positive for none.   All other systems are reviewed and negative.    PHYSICAL EXAM: VS:  BP 128/80 (BP Location: Right Arm, Patient Position: Sitting, Cuff Size: Large)   Pulse 75   Ht 6\' 2"  (1.88 m)   Wt 272 lb 8 oz (123.6 kg)   BMI 34.99 kg/m  , BMI Body mass index is 34.99 kg/m. GEN: Well nourished, well developed, in no acute distress  HEENT: normal  Neck: no JVD, carotid bruits, or masses Cardiac: RRR; no murmurs, rubs, or gallops,no edema  Respiratory:  clear to auscultation bilaterally, normal work of breathing GI: soft, nontender, nondistended, + BS MS: no deformity or atrophy  Skin: warm and dry, no rash Neuro:  Strength and sensation are intact Psych: euthymic mood, full affect   EKG:  EKG is ordered today. The ekg ordered today demonstrates  normal sinus  rhythm with no significant ST or T wave changes.   Recent Labs: 09/22/2016: ALT 13; BUN 15; Creatinine, Ser 0.82; Hemoglobin 13.7; Platelets 217.0; Potassium 3.8; Sodium 139; TSH 0.86    Lipid Panel    Component Value Date/Time   CHOL 223 (H) 03/20/2016 0801   TRIG 135.0 03/20/2016 0801   HDL 44.20 03/20/2016 0801   CHOLHDL 5 03/20/2016 0801   VLDL 27.0 03/20/2016 0801   LDLCALC 151 (H) 03/20/2016 0801   LDLDIRECT 151.2 04/21/2012 1132      Wt Readings from Last 3 Encounters:  09/29/16 272 lb 8 oz (123.6 kg)  09/22/16 271 lb (122.9 kg)  07/16/16  (!) 306 lb 3.2 oz (138.9 kg)       PAD Screen 09/29/2016  Previous PAD dx? No  Previous surgical procedure? No  Pain with walking? No  Feet/toe relief with dangling? No  Painful, non-healing ulcers? No  Extremities discolored? No      ASSESSMENT AND PLAN:  1.  Atypical chest pain: The quality of the pain does not seem anginal but he reports that his symptoms are mostly exertional. He had an echocardiogram in 2016 which was unremarkable. His cardiac exam is normal and baseline EKG does not show significant abnormalities. I recommend evaluation with a treadmill nuclear stress test.  2. Obesity: Status post gastric sleeve surgery. He lost 65 pounds so far.    Disposition:   FU with me as needed.   Signed,  Kathlyn Sacramento, MD  09/29/2016 5:56 PM    [place]

## 2016-10-02 ENCOUNTER — Encounter
Admission: RE | Admit: 2016-10-02 | Discharge: 2016-10-02 | Disposition: A | Discharge status: Home / Self Care | Payer: 59 | Source: Ambulatory Visit | Attending: Cardiovascular Disease | Admitting: Cardiovascular Disease

## 2016-10-02 DIAGNOSIS — R079 Chest pain, unspecified: Secondary | ICD-10-CM | POA: Diagnosis not present

## 2016-10-02 LAB — NM MYOCAR MULTI W/SPECT W/WALL MOTION / EF
CSEPED: 7 min
CSEPEDS: 25 s
CSEPEW: 9.1 METS
CSEPHR: 90 %
LV dias vol: 106 mL (ref 62–150)
LV sys vol: 30 mL
Peak HR: 157 {beats}/min
Rest HR: 69 {beats}/min
SDS: 0
SRS: 13
SSS: 2
TID: 0.88

## 2016-10-02 MED ORDER — TECHNETIUM TC 99M TETROFOSMIN IV KIT
13.0000 | PACK | Freq: Once | INTRAVENOUS | Status: AC | PRN
  Administered 2016-10-02: 13.414 via INTRAVENOUS

## 2016-10-02 MED ORDER — TECHNETIUM TC 99M TETROFOSMIN IV KIT
31.8000 | PACK | Freq: Once | INTRAVENOUS | Status: AC | PRN
  Administered 2016-10-02: 31.8 via INTRAVENOUS

## 2016-10-03 ENCOUNTER — Telehealth: Payer: Self-pay | Admitting: Cardiovascular Disease

## 2016-10-03 NOTE — Telephone Encounter (Signed)
Wife returned call. Explained the test results are net yet available but will call as soon as it is reviewed by Dr. Fletcher Anon. Wife agreeable.

## 2016-10-03 NOTE — Telephone Encounter (Signed)
Pt had nuclear testing yesterday. Results in MD basket awaiting review. Left message on wife's cell phone

## 2016-10-03 NOTE — Telephone Encounter (Signed)
Pt would like stress test results. Contact # is wife's cell, pt gives ok to give her results.

## 2016-10-03 NOTE — Telephone Encounter (Signed)
S/w pt's wife regarding nuc med results.  See result note

## 2016-10-06 ENCOUNTER — Encounter: Payer: Self-pay | Admitting: Cardiovascular Disease

## 2016-10-07 ENCOUNTER — Other Ambulatory Visit
Admission: RE | Admit: 2016-10-07 | Discharge: 2016-10-07 | Disposition: A | Discharge status: Home / Self Care | Payer: 59 | Source: Ambulatory Visit | Attending: Cardiovascular Disease | Admitting: Cardiovascular Disease

## 2016-10-07 ENCOUNTER — Other Ambulatory Visit: Payer: Self-pay

## 2016-10-07 DIAGNOSIS — Z01812 Encounter for preprocedural laboratory examination: Secondary | ICD-10-CM

## 2016-10-07 LAB — BASIC METABOLIC PANEL
ANION GAP: 6 (ref 5–15)
BUN: 15 mg/dL (ref 6–20)
CALCIUM: 9.4 mg/dL (ref 8.9–10.3)
CO2: 27 mmol/L (ref 22–32)
Chloride: 108 mmol/L (ref 101–111)
Creatinine, Ser: 0.99 mg/dL (ref 0.61–1.24)
Glucose, Bld: 91 mg/dL (ref 65–99)
Potassium: 3.9 mmol/L (ref 3.5–5.1)
SODIUM: 141 mmol/L (ref 135–145)

## 2016-10-07 LAB — CBC
HCT: 41 % (ref 40.0–52.0)
HEMOGLOBIN: 14.1 g/dL (ref 13.0–18.0)
MCH: 30.3 pg (ref 26.0–34.0)
MCHC: 34.5 g/dL (ref 32.0–36.0)
MCV: 87.9 fL (ref 80.0–100.0)
Platelets: 241 10*3/uL (ref 150–440)
RBC: 4.66 MIL/uL (ref 4.40–5.90)
RDW: 14.5 % (ref 11.5–14.5)
WBC: 7.4 10*3/uL (ref 3.8–10.6)

## 2016-10-07 LAB — PROTIME-INR
INR: 1.06
PROTHROMBIN TIME: 13.8 s (ref 11.4–15.2)

## 2016-10-10 ENCOUNTER — Encounter
Admission: RE | Disposition: A | Discharge status: Home / Self Care | Payer: Self-pay | Source: Ambulatory Visit | Attending: Cardiovascular Disease

## 2016-10-10 ENCOUNTER — Ambulatory Visit
Admission: RE | Admit: 2016-10-10 | Discharge: 2016-10-10 | Disposition: A | Discharge status: Home / Self Care | Payer: 59 | Source: Ambulatory Visit | Attending: Cardiovascular Disease | Admitting: Cardiovascular Disease

## 2016-10-10 ENCOUNTER — Encounter: Payer: Self-pay | Admitting: *Deleted

## 2016-10-10 DIAGNOSIS — R9439 Abnormal result of other cardiovascular function study: Secondary | ICD-10-CM | POA: Diagnosis present

## 2016-10-10 DIAGNOSIS — R7303 Prediabetes: Secondary | ICD-10-CM | POA: Insufficient documentation

## 2016-10-10 DIAGNOSIS — I700 Atherosclerosis of aorta: Secondary | ICD-10-CM | POA: Diagnosis not present

## 2016-10-10 DIAGNOSIS — I2510 Atherosclerotic heart disease of native coronary artery without angina pectoris: Secondary | ICD-10-CM | POA: Insufficient documentation

## 2016-10-10 DIAGNOSIS — Z6834 Body mass index (BMI) 34.0-34.9, adult: Secondary | ICD-10-CM | POA: Insufficient documentation

## 2016-10-10 DIAGNOSIS — Z9884 Bariatric surgery status: Secondary | ICD-10-CM | POA: Insufficient documentation

## 2016-10-10 DIAGNOSIS — E7800 Pure hypercholesterolemia, unspecified: Secondary | ICD-10-CM | POA: Diagnosis not present

## 2016-10-10 DIAGNOSIS — E669 Obesity, unspecified: Secondary | ICD-10-CM | POA: Insufficient documentation

## 2016-10-10 DIAGNOSIS — R079 Chest pain, unspecified: Secondary | ICD-10-CM

## 2016-10-10 HISTORY — PX: LEFT HEART CATH AND CORONARY ANGIOGRAPHY: CATH118249

## 2016-10-10 SURGERY — LEFT HEART CATH AND CORONARY ANGIOGRAPHY
Anesthesia: Moderate Sedation

## 2016-10-10 MED ORDER — SODIUM CHLORIDE 0.9% FLUSH: 3.0000 mL | INTRAVENOUS | Status: DC | PRN

## 2016-10-10 MED ORDER — SODIUM CHLORIDE 0.9 % IV SOLN: 250.0000 mL | INTRAVENOUS | Status: DC | PRN

## 2016-10-10 MED ORDER — SODIUM CHLORIDE 0.9 % IV SOLN
INTRAVENOUS | Status: DC
  Administered 2016-10-10: 07:00:00 via INTRAVENOUS

## 2016-10-10 MED ORDER — ASPIRIN 81 MG PO CHEW: 81.0000 mg | CHEWABLE_TABLET | ORAL | Status: DC

## 2016-10-10 MED ORDER — SODIUM CHLORIDE 0.9% FLUSH: 3.0000 mL | Freq: Two times a day (BID) | INTRAVENOUS | Status: DC

## 2016-10-10 MED ORDER — IOPAMIDOL (ISOVUE-300) INJECTION 61%
INTRAVENOUS | Status: DC | PRN
  Administered 2016-10-10: 90 mL via INTRA_ARTERIAL

## 2016-10-10 MED ORDER — HEPARIN (PORCINE) IN NACL 2-0.9 UNIT/ML-% IJ SOLN
INTRAMUSCULAR | Status: AC
  Filled 2016-10-10: qty 500

## 2016-10-10 MED ORDER — VERAPAMIL HCL 2.5 MG/ML IV SOLN
INTRAVENOUS | Status: DC | PRN
  Administered 2016-10-10: 2.5 mg via INTRA_ARTERIAL

## 2016-10-10 MED ORDER — SODIUM CHLORIDE 0.9 % IV SOLN: INTRAVENOUS | Status: DC

## 2016-10-10 MED ORDER — HEPARIN SODIUM (PORCINE) 1000 UNIT/ML IJ SOLN
INTRAMUSCULAR | Status: DC | PRN
  Administered 2016-10-10: 6000 [IU] via INTRAVENOUS

## 2016-10-10 MED ORDER — MIDAZOLAM HCL 2 MG/2ML IJ SOLN
INTRAMUSCULAR | Status: AC
  Filled 2016-10-10: qty 2

## 2016-10-10 MED ORDER — MIDAZOLAM HCL 2 MG/2ML IJ SOLN
INTRAMUSCULAR | Status: DC | PRN
  Administered 2016-10-10: 1 mg via INTRAVENOUS

## 2016-10-10 MED ORDER — FENTANYL CITRATE (PF) 100 MCG/2ML IJ SOLN
INTRAMUSCULAR | Status: DC | PRN
  Administered 2016-10-10: 25 ug via INTRAVENOUS

## 2016-10-10 MED ORDER — VERAPAMIL HCL 2.5 MG/ML IV SOLN
INTRAVENOUS | Status: AC
  Filled 2016-10-10: qty 2

## 2016-10-10 MED ORDER — HEPARIN SODIUM (PORCINE) 1000 UNIT/ML IJ SOLN
INTRAMUSCULAR | Status: AC
Start: 2016-10-10 — End: ?
  Filled 2016-10-10: qty 1

## 2016-10-10 MED ORDER — FENTANYL CITRATE (PF) 100 MCG/2ML IJ SOLN
INTRAMUSCULAR | Status: AC
  Filled 2016-10-10: qty 2

## 2016-10-10 SURGICAL SUPPLY — 8 items
CATH INFINITI 5FR ANG PIGTAIL (CATHETERS) ×2 IMPLANT
CATH OPTITORQUE JACKY 4.0 5F (CATHETERS) ×2 IMPLANT
DEVICE RAD COMP TR BAND LRG (VASCULAR PRODUCTS) ×2 IMPLANT
GLIDESHEATH SLEND SS 6F .021 (SHEATH) ×2 IMPLANT
KIT MANI 3VAL PERCEP (MISCELLANEOUS) ×3 IMPLANT
PACK CARDIAC CATH (CUSTOM PROCEDURE TRAY) ×3 IMPLANT
WIRE HITORQ VERSACORE ST 145CM (WIRE) IMPLANT
WIRE ROSEN-J .035X260CM (WIRE) ×2 IMPLANT

## 2016-10-10 NOTE — Interval H&P Note (Signed)
History and Physical Interval Note:  10/10/2016 8:01 AM  Brandon Bailey  has presented today for surgery, with the diagnosis of Left Heart Cath  The various methods of treatment have been discussed with the patient and family. After consideration of risks, benefits and other options for treatment, the patient has consented to  Procedure(s): Left Heart Cath and Coronary Angiography (N/A) as a surgical intervention .  The patient's history has been reviewed, patient examined, no change in status, stable for surgery.  I have reviewed the patient's chart and labs.  Questions were answered to the patient's satisfaction.     Brandon Bailey

## 2016-10-10 NOTE — H&P (View-Only) (Signed)
Cardiology Office Note   Date:  09/29/2016   ID:  Theodor Mustin, DOB 1970/06/18, MRN [id number]  PCP:  Dorena Cookey, MD  Cardiologist:   Kathlyn Sacramento, MD   Chief Complaint  Patient presents with  . other    Ref by Dr. Sarajane Jews for chest pain. Pt. c/o chest pain and shortness of breath this past Saturday. Meds reviewed by the pt. verbally.       History of Present Illness: Brandon Bailey is a 46 y.o. male who was referred by Dr. Sarajane Jews for evaluation of chest pain. He has no previous cardiac history. He had an echocardiogram in October, 2016 which showed normal LV systolic function, mild valvular abnormalities and no evidence of pulmonary hypertension. Other than obesity, he does not have significant chronic medical conditions. There is family history of coronary artery disease but not prematurely. He is not a smoker. He had recent left sided chest pain described as sharp discomfort with no radiation. This usually last for a few minutes and it happens mostly with activities. He reports chronic exertional dyspnea. No heartburn or palpitations. He had gastric sleeve surgery in March. UGI series with KUB were unremarkable.  CTA of chest showed no pulmonary embolism but there was aortic atherosclerosis.     Past Medical History:  Diagnosis Date  . High cholesterol   . Pre-diabetes     Past Surgical History:  Procedure Laterality Date  . BREATH TEK H PYLORI N/A 08/09/2014   Procedure: BREATH TEK H PYLORI;  Surgeon: Johnathan Hausen, MD;  Location: [facility];  Service: General;  Laterality: N/A;  . LAPAROSCOPIC GASTRIC SLEEVE RESECTION N/A 06/30/2016   Procedure: LAPAROSCOPIC GASTRIC SLEEVE RESECTION WITH UPPER ENDO;  Surgeon: Johnathan Hausen, MD;  Location: [facility];  Service: General;  Laterality: N/A;     Current Outpatient Prescriptions  Medication Sig Dispense Refill  . pantoprazole (PROTONIX) 40 MG tablet Take 40 mg by mouth daily.    Marland Kitchen VITAMIN D, ERGOCALCIFEROL, PO Take  5,000 Units by mouth daily.     No current facility-administered medications for this visit.     Allergies:   Patient has no known allergies.    Social History:  The patient  reports that he has never smoked. He has never used smokeless tobacco. He reports that he does not drink alcohol or use drugs.   Family History:  The patient's family history includes Allergies in his father; Cancer in his father; Heart disease in his paternal grandfather and paternal grandmother.    ROS:  Please see the history of present illness.   Otherwise, review of systems are positive for none.   All other systems are reviewed and negative.    PHYSICAL EXAM: VS:  BP 128/80 (BP Location: Right Arm, Patient Position: Sitting, Cuff Size: Large)   Pulse 75   Ht 6\' 2"  (1.88 m)   Wt 272 lb 8 oz (123.6 kg)   BMI 34.99 kg/m  , BMI Body mass index is 34.99 kg/m. GEN: Well nourished, well developed, in no acute distress  HEENT: normal  Neck: no JVD, carotid bruits, or masses Cardiac: RRR; no murmurs, rubs, or gallops,no edema  Respiratory:  clear to auscultation bilaterally, normal work of breathing GI: soft, nontender, nondistended, + BS MS: no deformity or atrophy  Skin: warm and dry, no rash Neuro:  Strength and sensation are intact Psych: euthymic mood, full affect   EKG:  EKG is ordered today. The ekg ordered today demonstrates  normal sinus  rhythm with no significant ST or T wave changes.   Recent Labs: 09/22/2016: ALT 13; BUN 15; Creatinine, Ser 0.82; Hemoglobin 13.7; Platelets 217.0; Potassium 3.8; Sodium 139; TSH 0.86    Lipid Panel    Component Value Date/Time   CHOL 223 (H) 03/20/2016 0801   TRIG 135.0 03/20/2016 0801   HDL 44.20 03/20/2016 0801   CHOLHDL 5 03/20/2016 0801   VLDL 27.0 03/20/2016 0801   LDLCALC 151 (H) 03/20/2016 0801   LDLDIRECT 151.2 04/21/2012 1132      Wt Readings from Last 3 Encounters:  09/29/16 272 lb 8 oz (123.6 kg)  09/22/16 271 lb (122.9 kg)  07/16/16  (!) 306 lb 3.2 oz (138.9 kg)       PAD Screen 09/29/2016  Previous PAD dx? No  Previous surgical procedure? No  Pain with walking? No  Feet/toe relief with dangling? No  Painful, non-healing ulcers? No  Extremities discolored? No      ASSESSMENT AND PLAN:  1.  Atypical chest pain: The quality of the pain does not seem anginal but he reports that his symptoms are mostly exertional. He had an echocardiogram in 2016 which was unremarkable. His cardiac exam is normal and baseline EKG does not show significant abnormalities. I recommend evaluation with a treadmill nuclear stress test.  2. Obesity: Status post gastric sleeve surgery. He lost 65 pounds so far.    Disposition:   FU with me as needed.   Signed,  Kathlyn Sacramento, MD  09/29/2016 5:56 PM    [place]

## 2016-11-03 ENCOUNTER — Ambulatory Visit (INDEPENDENT_AMBULATORY_CARE_PROVIDER_SITE_OTHER): Payer: 59 | Admitting: Pulmonary Disease

## 2016-11-03 ENCOUNTER — Encounter: Payer: Self-pay | Admitting: Physician Assistant

## 2016-11-03 VITALS — BP 124/76 | HR 87 | Ht 74.0 in | Wt 257.0 lb

## 2016-11-03 DIAGNOSIS — G4733 Obstructive sleep apnea (adult) (pediatric): Secondary | ICD-10-CM

## 2016-11-03 NOTE — Progress Notes (Signed)
   Subjective:    Patient ID: Brandon Bailey, male    DOB: 1970-10-12, 46 y.o.   MRN: [id number]  HPI  46 year old obese man with severe OSA noted on a home sleep study in 2016 maintained on auto CPAP since then with good improvement in his daytime somnolence and fatigue. He underwent gastric sleeve surgery in 06/2016 units lost 80 pounds since then to his current weight of 257 pounds. His wife has not noted snoring. He has stopped using a CPAP machine over the last few weeks. Feels rested when he wakes up in the morning and denies daytime somnolence or fatigue  Significant tests/ events reviewed  07/2014 Home sleep study> AHI 80 with oxygen desaturation as low as 53% 09/2014 autoCPAP  Review of Systems neg for any significant sore throat, dysphagia, itching, sneezing, nasal congestion or excess/ purulent secretions, fever, chills, sweats, unintended wt loss, pleuritic or exertional cp, hempoptysis, orthopnea pnd or change in chronic leg swelling. Also denies presyncope, palpitations, heartburn, abdominal pain, nausea, vomiting, diarrhea or change in bowel or urinary habits, dysuria,hematuria, rash, arthralgias, visual complaints, headache, numbness weakness or ataxia.     Objective:   Physical Exam   Gen. Pleasant, well-nourished, in no distress ENT - no thrush, no post nasal drip Neck: No JVD, no thyromegaly, no carotid bruits Lungs: no use of accessory muscles, no dullness to percussion, clear without rales or rhonchi  Cardiovascular: Rhythm regular, heart sounds  normal, no murmurs or gallops, no peripheral edema Musculoskeletal: No deformities, no cyanosis or clubbing         Assessment & Plan:

## 2016-11-03 NOTE — Assessment & Plan Note (Addendum)
We will repeat his home sleep study to see if weight loss has helped his OSA Note that OSA was very severe in 2016 with AHI of 80/hour Continue weight loss Lifestyle changes with diet and exercise recommended to maintain his weight loss

## 2016-11-03 NOTE — Patient Instructions (Signed)
Repeat home sleep study. Based on this we will advise you about CPAP

## 2016-11-05 NOTE — Progress Notes (Signed)
Cardiology Office Note Date:  11/06/2016  Patient ID:  Crisanto, Nied 1970-11-11, MRN [id number] PCP:  Dorena Cookey, MD  Cardiologist:  Dr. Fletcher Anon, MD    Chief Complaint: Follow up [place]  History of Present Illness: Brandon Bailey is a 46 y.o. male with history of nonobstructive CAD by [facility] 10/10/2016, HLD, morbid obesity s/p gastric sleeve in 06/2016, prediabetes, and family history of CAD (not premature) who presents for follow up afer recent [place] on 10/10/2016 for angina.   Patient was initially evaluated by Dr. Fletcher Anon, MD on 09/29/2016 for sharp, left-sided chest pain that was exertional and would last for a few minuites. There was chronic exertional dyspnea. CTA chest in 09/2016 was negative for PE. GI series with KUB was negative. No prior known cardiac history. Echo in 01/2015 showed normal LV systolic function with EF 50-55%, no RWMA, GR1DD, trivial TR, mildly dilated left atrium, normal RV cavity size and systolic function, PASP normal. He underwent nuclear stress testing on 10/02/2016 that showed a small defect of mild severity present in the basal inferoseptal and mid inferoseptal location, possibly due to ischemia. EF 55-65%. Low risk study overall, though study was very suboptimal due to extracardaic activity. Given the patient continued to note chest pain he underwent LHC on 10/10/2016 that showed mild nonobstructive one-vessel CAD affecting the ostium of the dominant LCx at 30% stenosis. LV systolic function was normal with EF 50-55%, and normal LVEDP. Pain was not felt to be cardiac in etiology.  Patient is doing well today. He does continue to note intermittent lower chest pain with exertion. However, this is significantly improved from the pain he was experiencing prior to his cardiac catheterization. He now feels like this pain is related to his reflux. He indicates he had been noncompliant with his Protonix though since his cardiac catheterization he has been taking Protonix more  consistently and noted an improvement in his symptoms. He has also significantly changed his diet and is eating much more healthy. The symptoms he has been experiencing are not limiting his functional status. He does not have any other concerns at this time.   Past Medical History:  Diagnosis Date  . Coronary artery disease, non-occlusive    a. LHC 10/10/2016: oLCx 30%, normal EF, nl LVEDP  . High cholesterol   . Morbid obesity ([place])   . Pre-diabetes     Past Surgical History:  Procedure Laterality Date  . BREATH TEK H PYLORI N/A 08/09/2014   Procedure: BREATH TEK H PYLORI;  Surgeon: Johnathan Hausen, MD;  Location: [facility];  Service: General;  Laterality: N/A;  . LAPAROSCOPIC GASTRIC SLEEVE RESECTION N/A 06/30/2016   Procedure: LAPAROSCOPIC GASTRIC SLEEVE RESECTION WITH UPPER ENDO;  Surgeon: Johnathan Hausen, MD;  Location: [facility];  Service: General;  Laterality: N/A;  . LEFT HEART CATH AND CORONARY ANGIOGRAPHY N/A 10/10/2016   Procedure: Left Heart Cath and Coronary Angiography;  Surgeon: Wellington Hampshire, MD;  Location: [facility];  Service: Cardiovascular;  Laterality: N/A;    Current Meds  Medication Sig  . aspirin EC 81 MG tablet Take 81 mg by mouth daily.  . Cholecalciferol (VITAMIN D) 2000 units CAPS Take 2,000 Units by mouth daily.  Marland Kitchen OVER THE COUNTER MEDICATION Take 2 tablets by mouth daily. OTC Bariatric Multivitamin post-bariatric surgery  . pantoprazole (PROTONIX) 40 MG tablet Take 40 mg by mouth daily.    Allergies:   Patient has no known allergies.   Social History:  The patient  reports that he has never smoked. He has never used smokeless tobacco. He reports that he does not drink alcohol or use drugs.   Family History:  The patient's family history includes Allergies in his father; Cancer in his father; Heart disease in his paternal grandfather and paternal grandmother.  ROS:   Review of Systems  Constitutional: Negative for chills, diaphoresis, fever,  malaise/fatigue and weight loss.  HENT: Negative for congestion.   Eyes: Negative for discharge and redness.  Respiratory: Negative for cough, hemoptysis, sputum production, shortness of breath and wheezing.   Cardiovascular: Positive for chest pain. Negative for palpitations, orthopnea, claudication, leg swelling and PND.  Gastrointestinal: Positive for heartburn. Negative for abdominal pain, blood in stool, melena, nausea and vomiting.  Genitourinary: Negative for hematuria.  Musculoskeletal: Negative for falls and myalgias.  Skin: Negative for rash.  Neurological: Negative for dizziness, tingling, tremors, sensory change, speech change, focal weakness, loss of consciousness and weakness.  Endo/Heme/Allergies: Does not bruise/bleed easily.  Psychiatric/Behavioral: Negative for substance abuse. The patient is not nervous/anxious.   All other systems reviewed and are negative.    PHYSICAL EXAM:  VS:  BP 128/70 (BP Location: Left Arm, Patient Position: Sitting, Cuff Size: Normal)   Pulse 81   Ht 6\' 2"  (1.88 m)   Wt 256 lb 8 oz (116.3 kg)   BMI 32.93 kg/m  BMI: Body mass index is 32.93 kg/m.  Physical Exam  Constitutional: He is oriented to person, place, and time. He appears well-developed and well-nourished.  HENT:  Head: Normocephalic and atraumatic.  Eyes: Right eye exhibits no discharge. Left eye exhibits no discharge.  Neck: Normal range of motion. No JVD present.  Cardiovascular: Normal rate, regular rhythm, S1 normal, S2 normal and normal heart sounds.  Exam reveals no distant heart sounds, no friction rub, no midsystolic click and no opening snap.   No murmur heard. Pulses:      Posterior tibial pulses are 2+ on the right side, and 2+ on the left side.  Right radial cath site well-healed. No bruising, swelling, erythema, bleeding, or tender to palpation. Radial pulse 2+.  Pulmonary/Chest: Effort normal and breath sounds normal. No respiratory distress. He has no decreased  breath sounds. He has no wheezes. He has no rales. He exhibits no tenderness.  Abdominal: Soft. He exhibits no distension. There is no tenderness.  Musculoskeletal: He exhibits no edema.  Neurological: He is alert and oriented to person, place, and time.  Skin: Skin is warm and dry. No cyanosis. Nails show no clubbing.  Psychiatric: He has a normal mood and affect. His speech is normal and behavior is normal. Judgment and thought content normal.     EKG:  Was ordered and interpreted by me today. Shows NSR, 79 bpm, no acute st/t changes  Recent Labs: 09/22/2016: ALT 13; TSH 0.86 10/07/2016: BUN 15; Creatinine, Ser 0.99; Hemoglobin 14.1; Platelets 241; Potassium 3.9; Sodium 141  03/20/2016: Cholesterol 223; HDL 44.20; LDL Cholesterol 151; Total CHOL/HDL Ratio 5; Triglycerides 135.0; VLDL 27.0   CrCl cannot be calculated (Patient's most recent lab result is older than the maximum 21 days allowed.).   Wt Readings from Last 3 Encounters:  11/06/16 256 lb 8 oz (116.3 kg)  11/03/16 257 lb (116.6 kg)  10/10/16 272 lb (123.4 kg)     Other studies reviewed: Additional studies/records reviewed today include: summarized above  ASSESSMENT AND PLAN:  1. Nonobstructive CAD: Currently symptom-free. Recent cardiac catheterization on 10/10/2016 that showed nonobstructive CAD with only 30% noted  in the ostium of the left circumflex. Symptoms patient has been experiencing are likely related to his reflux. Addition of Zantac as below. If Zantac does not improve symptoms, could consider addition of isosorbide mononitrate though would pursue GI etiology initially. Continue ASA 81 mg daily. Will pursue modification of risk factors. No further plans for ischemic evaluation at this time.  2. GERD: Suspect patient's symptoms are related to his reflux. He is now taking Protonix more consistently and has noted an improvement in his symptoms. Advised patient to also start taking Zantac 150 mg daily. Could consider  when necessary Tums/Rolaids. If patient does not note improvement with addition of Zantac could consider changing Protonix to Dexilant.  3. HLD: Check fasting lipid panel. Patient reports was previously on red yeast rice though self discontinued this months ago. Will plan to restart red yeast rice if LDL is not at goal less than 100. If not at goal, we'll plan repeat fasting lipid panel in November 2018. Check cmet. Continue healthy diet.  4. Morbid obesity: Status post gastric sleeve. Continued healthy lifestyle advised.  5. Prediabetes: Check hemoglobin A1c. Recommend sugar reduction. Would benefit from exercise routine. Follow up with PCP.   Disposition: F/u with me in 3 months.   Current medicines are reviewed at length with the patient today.  The patient did not have any concerns regarding medicines.  Melvern Banker PA-C 11/06/2016 8:19 AM     [place], [place] [address] [phone]

## 2016-11-06 ENCOUNTER — Ambulatory Visit (INDEPENDENT_AMBULATORY_CARE_PROVIDER_SITE_OTHER): Payer: 59 | Admitting: Physician Assistant

## 2016-11-06 ENCOUNTER — Encounter: Payer: Self-pay | Admitting: Physician Assistant

## 2016-11-06 VITALS — BP 128/70 | HR 81 | Ht 74.0 in | Wt 256.5 lb

## 2016-11-06 DIAGNOSIS — R7303 Prediabetes: Secondary | ICD-10-CM | POA: Diagnosis not present

## 2016-11-06 DIAGNOSIS — I2510 Atherosclerotic heart disease of native coronary artery without angina pectoris: Secondary | ICD-10-CM

## 2016-11-06 DIAGNOSIS — E782 Mixed hyperlipidemia: Secondary | ICD-10-CM

## 2016-11-06 DIAGNOSIS — K219 Gastro-esophageal reflux disease without esophagitis: Secondary | ICD-10-CM | POA: Diagnosis not present

## 2016-11-06 MED ORDER — RANITIDINE HCL 150 MG PO CAPS: 150.0000 mg | ORAL_CAPSULE | Freq: Two times a day (BID) | ORAL | 3 refills | Status: DC

## 2016-11-06 MED ORDER — RANITIDINE HCL 150 MG PO CAPS: 150.0000 mg | ORAL_CAPSULE | Freq: Every day | ORAL | 3 refills | Status: DC

## 2016-11-06 NOTE — Addendum Note (Signed)
Addended by: Vanessa Ralphs on: 11/06/2016 10:30 AM   Modules accepted: Orders

## 2016-11-06 NOTE — Patient Instructions (Addendum)
Medication Instructions:  Your physician has recommended you make the following change in your medication:  1- START Zantac 150 mg (1 tablet) by mouth once a day.   Labwork: Your physician recommends that you return for lab work in: TODAY (CBC, CMET, A1c, LIPID).   Testing/Procedures: none  Follow-Up: Your physician recommends that you schedule a follow-up appointment in: [place].   If you need a refill on your cardiac medications before your next appointment, please call your pharmacy.

## 2016-11-07 LAB — LIPID PANEL
Chol/HDL Ratio: 3.6 ratio (ref 0.0–5.0)
Cholesterol, Total: 150 mg/dL (ref 100–199)
HDL: 42 mg/dL (ref >39)
LDL Calculated: 89 mg/dL (ref 0–99)
TRIGLYCERIDES: 93 mg/dL (ref 0–149)
VLDL Cholesterol Cal: 19 mg/dL (ref 5–40)

## 2016-11-07 LAB — CBC WITH DIFFERENTIAL/PLATELET
Basophils Absolute: 0 10*3/uL (ref 0.0–0.2)
Basos: 0 %
EOS (ABSOLUTE): 0.1 10*3/uL (ref 0.0–0.4)
Eos: 2 %
Hematocrit: 45.3 % (ref 37.5–51.0)
Hemoglobin: 14.5 g/dL (ref 13.0–17.7)
IMMATURE GRANULOCYTES: 0 %
Immature Grans (Abs): 0 10*3/uL (ref 0.0–0.1)
Lymphocytes Absolute: 1.7 10*3/uL (ref 0.7–3.1)
Lymphs: 25 %
MCH: 28.9 pg (ref 26.6–33.0)
MCHC: 32 g/dL (ref 31.5–35.7)
MCV: 90 fL (ref 79–97)
MONOS ABS: 0.5 10*3/uL (ref 0.1–0.9)
Monocytes: 8 %
NEUTROS PCT: 65 %
Neutrophils Absolute: 4.4 10*3/uL (ref 1.4–7.0)
PLATELETS: 236 10*3/uL (ref 150–379)
RBC: 5.02 x10E6/uL (ref 4.14–5.80)
RDW: 13.8 % (ref 12.3–15.4)
WBC: 6.7 10*3/uL (ref 3.4–10.8)

## 2016-11-07 LAB — COMPREHENSIVE METABOLIC PANEL
ALT: 14 IU/L (ref 0–44)
AST: 16 IU/L (ref 0–40)
Albumin/Globulin Ratio: 1.6 (ref 1.2–2.2)
Albumin: 4.2 g/dL (ref 3.5–5.5)
Alkaline Phosphatase: 64 IU/L (ref 39–117)
BUN/Creatinine Ratio: 19 (ref 9–20)
BUN: 15 mg/dL (ref 6–24)
Bilirubin Total: 0.7 mg/dL (ref 0.0–1.2)
CALCIUM: 9.1 mg/dL (ref 8.7–10.2)
CO2: 22 mmol/L (ref 20–29)
Chloride: 105 mmol/L (ref 96–106)
Creatinine, Ser: 0.81 mg/dL (ref 0.76–1.27)
GFR calc Af Amer: 122 mL/min/{1.73_m2} (ref >59)
GFR, EST NON AFRICAN AMERICAN: 106 mL/min/{1.73_m2} (ref >59)
GLOBULIN, TOTAL: 2.6 g/dL (ref 1.5–4.5)
GLUCOSE: 89 mg/dL (ref 65–99)
Potassium: 4 mmol/L (ref 3.5–5.2)
SODIUM: 141 mmol/L (ref 134–144)
Total Protein: 6.8 g/dL (ref 6.0–8.5)

## 2016-11-07 LAB — HEMOGLOBIN A1C
Est. average glucose Bld gHb Est-mCnc: 105 mg/dL
HEMOGLOBIN A1C: 5.3 % (ref 4.8–5.6)

## 2016-11-10 ENCOUNTER — Other Ambulatory Visit: Payer: Self-pay

## 2016-11-17 DIAGNOSIS — G4733 Obstructive sleep apnea (adult) (pediatric): Secondary | ICD-10-CM

## 2016-11-21 ENCOUNTER — Telehealth: Payer: Self-pay | Admitting: Pulmonary Disease

## 2016-11-21 ENCOUNTER — Other Ambulatory Visit: Payer: Self-pay | Admitting: *Deleted

## 2016-11-21 DIAGNOSIS — G4733 Obstructive sleep apnea (adult) (pediatric): Secondary | ICD-10-CM

## 2016-11-21 NOTE — Telephone Encounter (Signed)
Per RA, HST showed that OSA has improved to just 15 events per hour. It increases to 37 events per hour when sleeping in the supine position. Suggests continuing CPAP and avoid sleeping in supine position.

## 2016-11-24 NOTE — Telephone Encounter (Signed)
Left message for patient to call back  

## 2016-11-25 ENCOUNTER — Telehealth: Payer: Self-pay | Admitting: Internal Medicine

## 2016-11-25 NOTE — Telephone Encounter (Signed)
Duplicate message. See telephone encounter (11/21/2016).

## 2016-11-25 NOTE — Telephone Encounter (Signed)
Spoke with pt. He is aware of his sleep study results. Pt states that he is willing to go back on his CPAP but feels that his pressure needs to be adjusted. He is aware that RA is on vacation and will address this message next week.  RA - please advise. Thanks.

## 2016-11-28 NOTE — Telephone Encounter (Signed)
We have him on auto CPAP settings. Please decrease his CPAP settings to 5-15 cm

## 2016-11-28 NOTE — Telephone Encounter (Signed)
Left a message for patient to call back. 

## 2016-12-01 NOTE — Telephone Encounter (Signed)
Pt aware of pressure change. Order placed for [facility] to make this adjustment. Pt aware that he can check in with [facility] after noon to ensure this is being taken care of. Nothing further needed.

## 2016-12-01 NOTE — Telephone Encounter (Signed)
Patient returning call - he can be reached at [phone] -pr

## 2016-12-03 ENCOUNTER — Telehealth: Payer: Self-pay | Admitting: Pulmonary Disease

## 2016-12-03 DIAGNOSIS — G4733 Obstructive sleep apnea (adult) (pediatric): Secondary | ICD-10-CM

## 2016-12-03 NOTE — Telephone Encounter (Signed)
lmtcb for Melissa.  

## 2016-12-04 NOTE — Telephone Encounter (Signed)
LM for Melissa.

## 2016-12-04 NOTE — Telephone Encounter (Signed)
New order placed with correct cpap pressure.  Melissa at [facility] aware.  Nothing further needed.

## 2016-12-04 NOTE — Telephone Encounter (Signed)
Decrease to 5-12 cm

## 2016-12-04 NOTE — Telephone Encounter (Signed)
Spoke with Melissa - pt is already on pressure 5-15cm Order that was sent 12/01/16 was to decrease pressure to 5-15cm but the patient is already on this, do you want to decrease further?  Please advise Dr Elsworth Soho. Thanks.

## 2016-12-26 ENCOUNTER — Encounter: Payer: Self-pay | Admitting: Family Medicine

## 2017-01-14 DIAGNOSIS — Z9884 Bariatric surgery status: Secondary | ICD-10-CM | POA: Diagnosis not present

## 2017-03-05 ENCOUNTER — Ambulatory Visit: Payer: 59 | Admitting: Cardiovascular Disease

## 2017-03-05 ENCOUNTER — Ambulatory Visit (INDEPENDENT_AMBULATORY_CARE_PROVIDER_SITE_OTHER): Payer: 59 | Admitting: Cardiovascular Disease

## 2017-03-05 ENCOUNTER — Encounter: Payer: Self-pay | Admitting: Cardiovascular Disease

## 2017-03-05 VITALS — BP 120/76 | HR 76 | Ht 74.0 in | Wt 245.5 lb

## 2017-03-05 DIAGNOSIS — I2510 Atherosclerotic heart disease of native coronary artery without angina pectoris: Secondary | ICD-10-CM | POA: Diagnosis not present

## 2017-03-05 NOTE — Patient Instructions (Signed)
Medication Instructions: No changes.   Labwork: None.   Procedures/Testing: None.   Follow-Up: As needed with Dr. Fletcher Anon.   Any Additional Special Instructions Will Be Listed Below (If Applicable).     If you need a refill on your cardiac medications before your next appointment, please call your pharmacy.

## 2017-03-05 NOTE — Progress Notes (Signed)
Cardiology Office Note   Date:  03/05/2017   ID:  Brandon Bailey, DOB 10/16/1970, MRN [id number]  PCP:  Dorena Cookey, MD  Cardiologist:   Kathlyn Sacramento, MD   Chief Complaint  Patient presents with  . other    3 month f/u no complaints feeling well. Meds reviewed verbally with pt.      History of Present Illness: Brandon Bailey is a 46 y.o. male who is here today for a follow-up visit regarding atypical chest pain.  He was seen in June for atypical chest pain.  Nuclear stress test showed possible inferolateral ischemia. I proceeded with a cardiac catheterization in July which showed mild nonobstructive one-vessel coronary artery disease with 30% ostial left circumflex stenosis.  Ejection fraction was 50-55% with left ventricular end-diastolic pressure of 13 mmHg.  The patient has been doing well with no recurrent chest pain.  His symptoms turned out to be due to GERD which improved with treatment.  He continues to lose weight after gastric sleeve surgery.   Past Medical History:  Diagnosis Date  . Coronary artery disease, non-occlusive    a. LHC 10/10/2016: oLCx 30%, normal EF, nl LVEDP  . High cholesterol   . Morbid obesity ([place])   . Pre-diabetes     Past Surgical History:  Procedure Laterality Date  . BREATH TEK H PYLORI N/A 08/09/2014   Procedure: BREATH TEK H PYLORI;  Surgeon: Johnathan Hausen, MD;  Location: [facility];  Service: General;  Laterality: N/A;  . LAPAROSCOPIC GASTRIC SLEEVE RESECTION N/A 06/30/2016   Procedure: LAPAROSCOPIC GASTRIC SLEEVE RESECTION WITH UPPER ENDO;  Surgeon: Johnathan Hausen, MD;  Location: [facility];  Service: General;  Laterality: N/A;  . LEFT HEART CATH AND CORONARY ANGIOGRAPHY N/A 10/10/2016   Procedure: Left Heart Cath and Coronary Angiography;  Surgeon: Wellington Hampshire, MD;  Location: [facility];  Service: Cardiovascular;  Laterality: N/A;     Current Outpatient Medications  Medication Sig Dispense Refill  . aspirin EC  81 MG tablet Take 81 mg by mouth daily.    . Cholecalciferol (VITAMIN D) 2000 units CAPS Take 2,000 Units by mouth daily.    Marland Kitchen OVER THE COUNTER MEDICATION Take 2 tablets by mouth daily. OTC Bariatric Multivitamin post-bariatric surgery    . ranitidine (ZANTAC) 150 MG capsule Take 1 capsule (150 mg total) by mouth daily. 90 capsule 3   No current facility-administered medications for this visit.     Allergies:   Patient has no known allergies.    Social History:  The patient  reports that  has never smoked. he has never used smokeless tobacco. He reports that he does not drink alcohol or use drugs.   Family History:  The patient's family history includes Allergies in his father; Cancer in his father; Heart disease in his paternal grandfather and paternal grandmother.    ROS:  Please see the history of present illness.   Otherwise, review of systems are positive for none.   All other systems are reviewed and negative.    PHYSICAL EXAM: VS:  BP 120/76 (BP Location: Left Arm, Patient Position: Sitting, Cuff Size: Normal)   Pulse 76   Ht 6\' 2"  (1.88 m)   Wt 245 lb 8 oz (111.4 kg)   BMI 31.52 kg/m  , BMI Body mass index is 31.52 kg/m. GEN: Well nourished, well developed, in no acute distress  HEENT: normal  Neck: no JVD, carotid bruits, or masses Cardiac: RRR; no murmurs, rubs, or  gallops,no edema  Respiratory:  clear to auscultation bilaterally, normal work of breathing GI: soft, nontender, nondistended, + BS MS: no deformity or atrophy  Skin: warm and dry, no rash Neuro:  Strength and sensation are intact Psych: euthymic mood, full affect   EKG:  EKG is not ordered today.   Recent Labs: 09/22/2016: TSH 0.86 11/06/2016: ALT 14; BUN 15; Creatinine, Ser 0.81; Hemoglobin 14.5; Platelets 236; Potassium 4.0; Sodium 141    Lipid Panel    Component Value Date/Time   CHOL 150 11/06/2016 0823   TRIG 93 11/06/2016 0823   HDL 42 11/06/2016 0823   CHOLHDL 3.6 11/06/2016 0823    CHOLHDL 5 03/20/2016 0801   VLDL 27.0 03/20/2016 0801   LDLCALC 89 11/06/2016 0823   LDLDIRECT 151.2 04/21/2012 1132      Wt Readings from Last 3 Encounters:  03/05/17 245 lb 8 oz (111.4 kg)  11/06/16 256 lb 8 oz (116.3 kg)  11/03/16 257 lb (116.6 kg)       PAD Screen 09/29/2016  Previous PAD dx? No  Previous surgical procedure? No  Pain with walking? No  Feet/toe relief with dangling? No  Painful, non-healing ulcers? No  Extremities discolored? No      ASSESSMENT AND PLAN:  1.  Mild nonobstructive coronary artery disease: The patient has no anginal symptoms.  I reviewed cardiac catheterization images with him.  I recommend healthy lifestyle changes.  It is reassuring that his lipid profile improved significantly without treatment and also hemoglobin A1c.  2. Obesity: Status post gastric sleeve surgery. He lost 90 pounds so far.  I discussed with him the importance of healthy diet and exercise.     Disposition:   FU with me as needed.   Signed,  Kathlyn Sacramento, MD  03/05/2017 2:24 PM    [place]

## 2017-07-02 ENCOUNTER — Other Ambulatory Visit: Payer: Self-pay | Admitting: Family Medicine

## 2017-07-02 ENCOUNTER — Ambulatory Visit
Admission: RE | Admit: 2017-07-02 | Discharge: 2017-07-02 | Disposition: A | Discharge status: Home / Self Care | Payer: 59 | Source: Ambulatory Visit | Attending: Family Medicine | Admitting: Family Medicine

## 2017-07-02 DIAGNOSIS — M5489 Other dorsalgia: Secondary | ICD-10-CM

## 2017-07-02 DIAGNOSIS — M546 Pain in thoracic spine: Secondary | ICD-10-CM

## 2017-07-02 DIAGNOSIS — M542 Cervicalgia: Secondary | ICD-10-CM

## 2017-07-02 DIAGNOSIS — M5136 Other intervertebral disc degeneration, lumbar region: Secondary | ICD-10-CM | POA: Diagnosis not present

## 2017-07-02 DIAGNOSIS — M47812 Spondylosis without myelopathy or radiculopathy, cervical region: Secondary | ICD-10-CM | POA: Diagnosis not present

## 2017-07-02 DIAGNOSIS — M5134 Other intervertebral disc degeneration, thoracic region: Secondary | ICD-10-CM | POA: Diagnosis not present

## 2017-07-09 DIAGNOSIS — Z9884 Bariatric surgery status: Secondary | ICD-10-CM | POA: Diagnosis not present

## 2017-07-17 DIAGNOSIS — Z09 Encounter for follow-up examination after completed treatment for conditions other than malignant neoplasm: Secondary | ICD-10-CM | POA: Diagnosis not present

## 2017-07-23 DIAGNOSIS — M542 Cervicalgia: Secondary | ICD-10-CM | POA: Diagnosis not present

## 2017-07-23 DIAGNOSIS — M545 Low back pain: Secondary | ICD-10-CM | POA: Diagnosis not present

## 2017-07-23 DIAGNOSIS — M546 Pain in thoracic spine: Secondary | ICD-10-CM | POA: Diagnosis not present

## 2017-07-28 ENCOUNTER — Encounter: Payer: Self-pay | Admitting: Family Medicine

## 2017-07-28 ENCOUNTER — Ambulatory Visit (INDEPENDENT_AMBULATORY_CARE_PROVIDER_SITE_OTHER): Payer: 59 | Admitting: Family Medicine

## 2017-07-28 VITALS — BP 130/82 | HR 70 | Temp 98.1°F | Ht 74.0 in | Wt 227.0 lb

## 2017-07-28 DIAGNOSIS — Z0001 Encounter for general adult medical examination with abnormal findings: Secondary | ICD-10-CM

## 2017-07-28 DIAGNOSIS — M546 Pain in thoracic spine: Secondary | ICD-10-CM

## 2017-07-28 NOTE — Patient Instructions (Signed)
Continue your good health habits diet and exercise program...........Marland Kitchen remember to walk 30 minutes daily  We discussed the thoracic posture issue. Keep your computer at eye level and imagined versus strain pulling her head off her shoulders  Return in one year for general physical exam sooner if any problems

## 2017-07-28 NOTE — Progress Notes (Signed)
Brandon Bailey is a 47 year old married male nonsmoker who comes in today for annual physical examination because of a history of obesity  2 years ago his weight was up to 354 pounds. He had a procedure done by Dr. Hassell Done 2016. A redo was done in 2018. Since that time he's lost 127 pounds.  BP normal 130/82 A1c in August 2018 was 5.3 with a random blood sugar of 67. Lipids were done in August and they were normal with an LDL of 89 total cholesterol 150. Renal function normal creatinine 0.8.  He gets routine eye care, dental care, colonoscopy not too age 10. No family history of colon cancer polyps  Vaccinations up-to-date tetanus booster due April 2020.  14 point review of systems June otherwise negative except she's having some shoulder pain. It comes and goes. No history of trauma.  BP 130/82 (BP Location: Left Arm, Patient Position: Sitting, Cuff Size: Normal)   Pulse 70   Temp 98.1 F (36.7 C) (Oral)   Ht 6\' 2"  (1.88 m)   Wt 227 lb (103 kg)   BMI 29.15 kg/m  Well-developed well-nourished male no acute distress vital signs stable he is afebrile HEENT were negative neck was supple thyroid is not enlarged cardiopulmonary exam normal. Abdominal exam normal except for scars from previous laparoscopic bariatric surgery. Extremities normal skin normal peripheral pulses normal except he does have some scoliosis. He said all the family members have the same problem. 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  #1 healthy male  #2 into deviation of spine..........Marland Kitchen recommend exercise computer at eye level etc. etc.

## 2017-08-17 DIAGNOSIS — M546 Pain in thoracic spine: Secondary | ICD-10-CM | POA: Diagnosis not present

## 2017-08-17 DIAGNOSIS — M545 Low back pain: Secondary | ICD-10-CM | POA: Diagnosis not present

## 2017-08-17 DIAGNOSIS — M542 Cervicalgia: Secondary | ICD-10-CM | POA: Diagnosis not present

## 2017-09-16 DIAGNOSIS — M546 Pain in thoracic spine: Secondary | ICD-10-CM | POA: Diagnosis not present

## 2017-09-16 DIAGNOSIS — M545 Low back pain: Secondary | ICD-10-CM | POA: Diagnosis not present

## 2017-09-16 DIAGNOSIS — M542 Cervicalgia: Secondary | ICD-10-CM | POA: Diagnosis not present

## 2017-10-26 DIAGNOSIS — M545 Low back pain: Secondary | ICD-10-CM | POA: Diagnosis not present

## 2017-10-26 DIAGNOSIS — M546 Pain in thoracic spine: Secondary | ICD-10-CM | POA: Diagnosis not present

## 2017-10-26 DIAGNOSIS — M542 Cervicalgia: Secondary | ICD-10-CM | POA: Diagnosis not present

## 2017-11-26 DIAGNOSIS — M546 Pain in thoracic spine: Secondary | ICD-10-CM | POA: Diagnosis not present

## 2017-11-26 DIAGNOSIS — M545 Low back pain: Secondary | ICD-10-CM | POA: Diagnosis not present

## 2017-11-26 DIAGNOSIS — M542 Cervicalgia: Secondary | ICD-10-CM | POA: Diagnosis not present

## 2018-01-20 DIAGNOSIS — M461 Sacroiliitis, not elsewhere classified: Secondary | ICD-10-CM | POA: Diagnosis not present

## 2018-01-20 DIAGNOSIS — M542 Cervicalgia: Secondary | ICD-10-CM | POA: Diagnosis not present

## 2018-01-20 DIAGNOSIS — M546 Pain in thoracic spine: Secondary | ICD-10-CM | POA: Diagnosis not present

## 2018-03-17 DIAGNOSIS — M461 Sacroiliitis, not elsewhere classified: Secondary | ICD-10-CM | POA: Diagnosis not present

## 2018-03-17 DIAGNOSIS — M542 Cervicalgia: Secondary | ICD-10-CM | POA: Diagnosis not present

## 2018-03-17 DIAGNOSIS — M546 Pain in thoracic spine: Secondary | ICD-10-CM | POA: Diagnosis not present

## 2018-03-26 DIAGNOSIS — Z09 Encounter for follow-up examination after completed treatment for conditions other than malignant neoplasm: Secondary | ICD-10-CM | POA: Diagnosis not present

## 2018-04-22 ENCOUNTER — Telehealth: Payer: Self-pay | Admitting: *Deleted

## 2018-04-22 NOTE — Telephone Encounter (Signed)
Tommi Rumps are you okay with [place] moving the TOC to April for this patient?  Currently scheduled on 04/28/2018 and you are off that day.  Pt cannot reschedule until 07/2018.

## 2018-04-22 NOTE — Telephone Encounter (Signed)
Copied from [place] [phone] [id number]. Topic: Appointment Scheduling - Scheduling Inquiry for Clinic >> Apr 22, 2018 11:47 AM Antonieta Iba C wrote: Reason for CRM: pt has a TOC pt with Tommi Rumps on 04/28/18, provider will be out of the office. Pt would like to have this visit in April. Could the office assist with rescheduling ov? Schedule will not allow due to PCP no longer taking TOC apt.

## 2018-04-22 NOTE — Telephone Encounter (Signed)
That is fine 

## 2018-04-22 NOTE — Telephone Encounter (Signed)
Will send to [facility] to see if she is able to override the system to schedule this patient for April  LM for patient to return call to see when he wanted to come in April

## 2018-04-27 NOTE — Telephone Encounter (Signed)
Calling to schedule pt for [facility], but appt desk shows pt has appt 05/04/18.   LMTCB to confirm what exactly pt needs or which appt he wants to keep.

## 2018-04-28 ENCOUNTER — Encounter: Payer: 59 | Admitting: Adult Health

## 2018-05-04 ENCOUNTER — Encounter: Payer: Self-pay | Admitting: Adult Health

## 2018-05-04 ENCOUNTER — Ambulatory Visit (INDEPENDENT_AMBULATORY_CARE_PROVIDER_SITE_OTHER): Payer: 59 | Admitting: Adult Health

## 2018-05-04 VITALS — BP 110/70 | Temp 98.4°F | Wt 243.0 lb

## 2018-05-04 DIAGNOSIS — I2510 Atherosclerotic heart disease of native coronary artery without angina pectoris: Secondary | ICD-10-CM

## 2018-05-04 DIAGNOSIS — Z7689 Persons encountering health services in other specified circumstances: Secondary | ICD-10-CM | POA: Diagnosis not present

## 2018-05-04 DIAGNOSIS — Z9884 Bariatric surgery status: Secondary | ICD-10-CM

## 2018-05-04 NOTE — Progress Notes (Signed)
Patient presents to clinic today to establish care. He is a pleasant 48 year old male who  has a past medical history of Coronary artery disease, non-occlusive, High cholesterol, Morbid obesity ([place]), and Pre-diabetes.  He is a previous patient of Dr. Sherren Mocha  His last CPE was in 03/2016   Acute Concerns: Establish Care  Chronic Issues:  CAD -had cardiac catheterization in July 2018, which showed mild nonobstructive one-vessel coronary artery disease with 30% ostial left circumflex flex stenosis.  His EF was 50 to 55% with left ventricular end-diastolic pressure of 13 mmHg. He was followed by Cardiology but needs to be seen PRN    Obesity - s/p gastric sleeve 06/2016. His weight is up slightly- reports that this is due to diet. He walks about 20-30 minutes 3-4 times a week.  Wt Readings from Last 10 Encounters:  05/04/18 243 lb (110.2 kg)  07/28/17 227 lb (103 kg)  03/05/17 245 lb 8 oz (111.4 kg)  11/06/16 256 lb 8 oz (116.3 kg)  11/03/16 257 lb (116.6 kg)  10/10/16 272 lb (123.4 kg)  09/29/16 272 lb 8 oz (123.6 kg)  09/22/16 271 lb (122.9 kg)  07/16/16 (!) 306 lb 3.2 oz (138.9 kg)  06/30/16 (!) 323 lb 6 oz (146.7 kg)   Health Maintenance: Dental -- routine  Vision -- routine Immunizations -- Refused flu shot.  Colonoscopy -- Never had   Treatment Team  - Cardiology - Dr. Fletcher Anon    Past Medical History:  Diagnosis Date  . Coronary artery disease, non-occlusive    a. LHC 10/10/2016: oLCx 30%, normal EF, nl LVEDP  . High cholesterol   . Morbid obesity ([place])   . Pre-diabetes     Past Surgical History:  Procedure Laterality Date  . BREATH TEK H PYLORI N/A 08/09/2014   Procedure: BREATH TEK H PYLORI;  Surgeon: Johnathan Hausen, MD;  Location: [facility];  Service: General;  Laterality: N/A;  . LAPAROSCOPIC GASTRIC SLEEVE RESECTION N/A 06/30/2016   Procedure: LAPAROSCOPIC GASTRIC SLEEVE RESECTION WITH UPPER ENDO;  Surgeon: Johnathan Hausen, MD;  Location: [facility];  Service:  General;  Laterality: N/A;  . LEFT HEART CATH AND CORONARY ANGIOGRAPHY N/A 10/10/2016   Procedure: Left Heart Cath and Coronary Angiography;  Surgeon: Wellington Hampshire, MD;  Location: [facility];  Service: Cardiovascular;  Laterality: N/A;    Current Outpatient Medications on File Prior to Visit  Medication Sig Dispense Refill  . aspirin EC 81 MG tablet Take 81 mg by mouth daily.    . Cholecalciferol (VITAMIN D) 2000 units CAPS Take 2,000 Units by mouth daily.    Marland Kitchen OVER THE COUNTER MEDICATION Take 2 tablets by mouth daily. OTC Bariatric Multivitamin post-bariatric surgery    . ranitidine (ZANTAC) 150 MG capsule Take 1 capsule (150 mg total) by mouth daily. 90 capsule 3   No current facility-administered medications on file prior to visit.     No Known Allergies  Family History  Problem Relation Age of Onset  . Cancer Father        melanoma  . Allergies Father   . Heart disease Paternal Grandfather   . Heart disease Paternal Grandmother     Social History   Socioeconomic History  . Marital status: Married    Spouse name: Not on file  . Number of children: 2  . Years of education: Not on file  . Highest education level: Not on file  Occupational History  . Occupation: Writer  Social Needs  . Financial resource strain: Not on file  . Food insecurity:    Worry: Not on file    Inability: Not on file  . Transportation needs:    Medical: Not on file    Non-medical: Not on file  Tobacco Use  . Smoking status: Never Smoker  . Smokeless tobacco: Never Used  Substance and Sexual Activity  . Alcohol use: No    Alcohol/week: 0.0 standard drinks  . Drug use: No  . Sexual activity: Not on file  Lifestyle  . Physical activity:    Days per week: Not on file    Minutes per session: Not on file  . Stress: Not on file  Relationships  . Social connections:    Talks on phone: Not on file    Gets together: Not on file    Attends religious service: Not on  file    Active member of club or organization: Not on file    Attends meetings of clubs or organizations: Not on file    Relationship status: Not on file  . Intimate partner violence:    Fear of current or ex partner: Not on file    Emotionally abused: Not on file    Physically abused: Not on file    Forced sexual activity: Not on file  Other Topics Concern  . Not on file  Social History Narrative  . Not on file    Review of Systems  Constitutional: Negative.   HENT: Negative.   Eyes: Negative.   Respiratory: Negative.   Cardiovascular: Negative.   Gastrointestinal: Negative.   Genitourinary: Negative.   Musculoskeletal: Negative.   Skin: Negative.   Neurological: Negative.   Endo/Heme/Allergies: Negative.   Psychiatric/Behavioral: Negative.   All other systems reviewed and are negative.    BP 110/70   Temp 98.4 F (36.9 C)   Wt 243 lb (110.2 kg)   BMI 31.20 kg/m   Physical Exam Vitals signs and nursing note reviewed.  Constitutional:      General: He is not in acute distress.    Appearance: Normal appearance. He is well-developed and normal weight. He is not diaphoretic.  HENT:     Head: Normocephalic and atraumatic.     Right Ear: Tympanic membrane, ear canal and external ear normal.     Left Ear: Tympanic membrane, ear canal and external ear normal.     Nose: Nose normal.     Mouth/Throat:     Pharynx: No oropharyngeal exudate.  Eyes:     General:        Right eye: No discharge.        Left eye: No discharge.     Conjunctiva/sclera: Conjunctivae normal.     Pupils: Pupils are equal, round, and reactive to light.  Neck:     Thyroid: No thyromegaly.     Trachea: No tracheal deviation.  Cardiovascular:     Rate and Rhythm: Normal rate and regular rhythm.     Heart sounds: Normal heart sounds. No murmur. No friction rub. No gallop.   Pulmonary:     Effort: Pulmonary effort is normal. No respiratory distress.     Breath sounds: Normal breath sounds. No  wheezing or rales.  Chest:     Chest wall: No tenderness.  Abdominal:     General: Bowel sounds are normal. There is no distension.     Palpations: Abdomen is soft.     Tenderness: There is no abdominal tenderness. There is no  guarding or rebound.  Musculoskeletal: Normal range of motion.  Lymphadenopathy:     Cervical: No cervical adenopathy.  Skin:    General: Skin is warm and dry.     Coloration: Skin is not pale.     Findings: No erythema or rash.  Neurological:     Mental Status: He is alert and oriented to person, place, and time.     Cranial Nerves: No cranial nerve deficit.     Coordination: Coordination normal.  Psychiatric:        Thought Content: Thought content normal.        Judgment: Judgment normal.     Assessment/Plan: 1. Encounter to establish care - Follow up for CPE  - Follow up sooner if needed - Encouraged lifestyle modifications to help lose weight   2. Coronary artery disease, non-occlusive - Will continue to monitor   3. S/P laparoscopic sleeve gastrectomy March 2018 - Work on life style modifications   Dorothyann Peng, NP

## 2018-05-04 NOTE — Patient Instructions (Signed)
It was great meeting you today!  Please follow up for your physical   If you need anything in the meantime, please let me kow

## 2018-05-24 DIAGNOSIS — M461 Sacroiliitis, not elsewhere classified: Secondary | ICD-10-CM | POA: Diagnosis not present

## 2018-05-24 DIAGNOSIS — M546 Pain in thoracic spine: Secondary | ICD-10-CM | POA: Diagnosis not present

## 2018-05-24 DIAGNOSIS — M542 Cervicalgia: Secondary | ICD-10-CM | POA: Diagnosis not present

## 2018-07-21 DIAGNOSIS — M546 Pain in thoracic spine: Secondary | ICD-10-CM | POA: Diagnosis not present

## 2018-07-21 DIAGNOSIS — M542 Cervicalgia: Secondary | ICD-10-CM | POA: Diagnosis not present

## 2018-07-21 DIAGNOSIS — M461 Sacroiliitis, not elsewhere classified: Secondary | ICD-10-CM | POA: Diagnosis not present

## 2018-08-04 ENCOUNTER — Encounter: Payer: 59 | Admitting: Adult Health

## 2018-11-03 ENCOUNTER — Ambulatory Visit (INDEPENDENT_AMBULATORY_CARE_PROVIDER_SITE_OTHER): Payer: 59 | Admitting: Adult Health

## 2018-11-03 ENCOUNTER — Other Ambulatory Visit: Payer: Self-pay

## 2018-11-03 ENCOUNTER — Encounter: Payer: Self-pay | Admitting: Adult Health

## 2018-11-03 VITALS — BP 126/72 | Temp 98.0°F | Ht 73.75 in | Wt 256.0 lb

## 2018-11-03 DIAGNOSIS — I2510 Atherosclerotic heart disease of native coronary artery without angina pectoris: Secondary | ICD-10-CM

## 2018-11-03 DIAGNOSIS — Z125 Encounter for screening for malignant neoplasm of prostate: Secondary | ICD-10-CM | POA: Diagnosis not present

## 2018-11-03 DIAGNOSIS — Z23 Encounter for immunization: Secondary | ICD-10-CM | POA: Diagnosis not present

## 2018-11-03 DIAGNOSIS — E7439 Other disorders of intestinal carbohydrate absorption: Secondary | ICD-10-CM

## 2018-11-03 DIAGNOSIS — Z9884 Bariatric surgery status: Secondary | ICD-10-CM

## 2018-11-03 DIAGNOSIS — Z0000 Encounter for general adult medical examination without abnormal findings: Secondary | ICD-10-CM

## 2018-11-03 DIAGNOSIS — E668 Other obesity: Secondary | ICD-10-CM | POA: Diagnosis not present

## 2018-11-03 LAB — COMPREHENSIVE METABOLIC PANEL
ALT: 13 U/L (ref 0–53)
AST: 16 U/L (ref 0–37)
Albumin: 4.6 g/dL (ref 3.5–5.2)
Alkaline Phosphatase: 48 U/L (ref 39–117)
BUN: 17 mg/dL (ref 6–23)
CO2: 29 mEq/L (ref 19–32)
Calcium: 9.5 mg/dL (ref 8.4–10.5)
Chloride: 103 mEq/L (ref 96–112)
Creatinine, Ser: 0.88 mg/dL (ref 0.40–1.50)
GFR: 91.93 mL/min (ref >60.00)
Glucose, Bld: 91 mg/dL (ref 70–99)
Potassium: 4.4 mEq/L (ref 3.5–5.1)
Sodium: 140 mEq/L (ref 135–145)
Total Bilirubin: 0.8 mg/dL (ref 0.2–1.2)
Total Protein: 7.1 g/dL (ref 6.0–8.3)

## 2018-11-03 LAB — CBC WITH DIFFERENTIAL/PLATELET
Basophils Absolute: 0 10*3/uL (ref 0.0–0.1)
Basophils Relative: 0.7 % (ref 0.0–3.0)
Eosinophils Absolute: 0.1 10*3/uL (ref 0.0–0.7)
Eosinophils Relative: 2.4 % (ref 0.0–5.0)
HCT: 44.3 % (ref 39.0–52.0)
Hemoglobin: 14.7 g/dL (ref 13.0–17.0)
Lymphocytes Relative: 24.2 % (ref 12.0–46.0)
Lymphs Abs: 1.2 10*3/uL (ref 0.7–4.0)
MCHC: 33.1 g/dL (ref 30.0–36.0)
MCV: 88.3 fl (ref 78.0–100.0)
Monocytes Absolute: 0.4 10*3/uL (ref 0.1–1.0)
Monocytes Relative: 7.5 % (ref 3.0–12.0)
Neutro Abs: 3.4 10*3/uL (ref 1.4–7.7)
Neutrophils Relative %: 65.2 % (ref 43.0–77.0)
Platelets: 235 10*3/uL (ref 150.0–400.0)
RBC: 5.02 Mil/uL (ref 4.22–5.81)
RDW: 13.6 % (ref 11.5–15.5)
WBC: 5.1 10*3/uL (ref 4.0–10.5)

## 2018-11-03 LAB — LIPID PANEL
Cholesterol: 217 mg/dL — ABNORMAL HIGH (ref 0–200)
HDL: 52.8 mg/dL (ref >39.00)
LDL Cholesterol: 139 mg/dL — ABNORMAL HIGH (ref 0–99)
NonHDL: 164.33
Total CHOL/HDL Ratio: 4
Triglycerides: 127 mg/dL (ref 0.0–149.0)
VLDL: 25.4 mg/dL (ref 0.0–40.0)

## 2018-11-03 LAB — IBC + FERRITIN
Ferritin: 18.1 ng/mL — ABNORMAL LOW (ref 22.0–322.0)
Iron: 120 ug/dL (ref 42–165)
Saturation Ratios: 25.2 % (ref 20.0–50.0)
Transferrin: 340 mg/dL (ref 212.0–360.0)

## 2018-11-03 LAB — PSA: PSA: 0.35 ng/mL (ref 0.10–4.00)

## 2018-11-03 LAB — TSH: TSH: 1.34 u[IU]/mL (ref 0.35–4.50)

## 2018-11-03 LAB — VITAMIN B12: Vitamin B-12: 436 pg/mL (ref 211–911)

## 2018-11-03 LAB — VITAMIN D 25 HYDROXY (VIT D DEFICIENCY, FRACTURES): VITD: 22.21 ng/mL — ABNORMAL LOW (ref 30.00–100.00)

## 2018-11-03 LAB — HEMOGLOBIN A1C: Hgb A1c MFr Bld: 5.7 % (ref 4.6–6.5)

## 2018-11-03 NOTE — Progress Notes (Signed)
Subjective:    Patient ID: Brandon Bailey, male    DOB: 1970-05-28, 48 y.o.   MRN: [id number]  HPI  Patient presents for yearly preventative medicine examination. He is a pleasant 48 year old male who  has a past medical history of Coronary artery disease, non-occlusive, High cholesterol, Morbid obesity ([place]), and Pre-diabetes.  CAD -history of cardiac cath in July 2018.  This showed mild nonobstructive one-vessel coronary artery disease with 30% ostial left circumflex stenosis.  His EF was 50 to 55% with left ventricular end-diastolic pressure of 13 mmHG. He takes daily baby asa. Does not take statin as his cholesterol panel improved with weight loss after cardiac cath  Lab Results  Component Value Date   CHOL 150 11/06/2016   HDL 42 11/06/2016   LDLCALC 89 11/06/2016   LDLDIRECT 151.2 04/21/2012   TRIG 93 11/06/2016   CHOLHDL 3.6 11/06/2016   Obesity -status post gastric sleeve in March 2018.  At his visit in January 2020 his weight had increased from 227-243.  He blamed this on diet. Today his weight continues to be climbing. He is up 29 pounds over the last 15  Months.Since he has been working from home he has not been exercising. He has been eating healthy.    He has followed up with his surgeon for his yearly visit. Reports that they did not check labs  Wt Readings from Last 3 Encounters:  11/03/18 256 lb (116.1 kg)  05/04/18 243 lb (110.2 kg)  07/28/17 227 lb (103 kg)    H/O Pre Diabetes -prior to gastric sleeve. Lab Results  Component Value Date   HGBA1C 5.3 11/06/2016    All immunizations and health maintenance protocols were reviewed with the patient and needed orders were placed. Due for Tdap   Appropriate screening laboratory values were ordered for the patient including screening of hyperlipidemia, renal function and hepatic function. If indicated by BPH, a PSA was ordered.  Medication reconciliation,  past medical history, social history, problem list and  allergies were reviewed in detail with the patient  Goals were established with regard to weight loss, exercise, and  diet in compliance with medications  Wt Readings from Last 3 Encounters:  11/03/18 256 lb (116.1 kg)  05/04/18 243 lb (110.2 kg)  07/28/17 227 lb (103 kg)   He is up-to-date on routine dental and vision screens.  Will be due for colonoscopy next year  Review of Systems  Constitutional: Negative.   HENT: Negative.   Eyes: Negative.   Respiratory: Negative.   Cardiovascular: Negative.   Gastrointestinal: Negative.   Endocrine: Negative.   Genitourinary: Negative.   Musculoskeletal: Negative.   Skin: Negative.   Allergic/Immunologic: Negative.   Neurological: Negative.   Hematological: Negative.   Psychiatric/Behavioral: Negative.   All other systems reviewed and are negative.  Past Medical History:  Diagnosis Date  . Coronary artery disease, non-occlusive    a. LHC 10/10/2016: oLCx 30%, normal EF, nl LVEDP  . High cholesterol   . Morbid obesity ([place])   . Pre-diabetes     Social History   Socioeconomic History  . Marital status: Married    Spouse name: Not on file  . Number of children: 2  . Years of education: Not on file  . Highest education level: Not on file  Occupational History  . Occupation: Writer  Social Needs  . Financial resource strain: Not on file  . Food insecurity    Worry: Not on file  Inability: Not on file  . Transportation needs    Medical: Not on file    Non-medical: Not on file  Tobacco Use  . Smoking status: Never Smoker  . Smokeless tobacco: Never Used  Substance and Sexual Activity  . Alcohol use: No    Alcohol/week: 0.0 standard drinks  . Drug use: No  . Sexual activity: Not on file  Lifestyle  . Physical activity    Days per week: Not on file    Minutes per session: Not on file  . Stress: Not on file  Relationships  . Social Herbalist on phone: Not on file    Gets together: Not on  file    Attends religious service: Not on file    Active member of club or organization: Not on file    Attends meetings of clubs or organizations: Not on file    Relationship status: Not on file  . Intimate partner violence    Fear of current or ex partner: Not on file    Emotionally abused: Not on file    Physically abused: Not on file    Forced sexual activity: Not on file  Other Topics Concern  . Not on file  Social History Narrative   Works for [employer].     Past Surgical History:  Procedure Laterality Date  . BREATH TEK H PYLORI N/A 08/09/2014   Procedure: BREATH TEK H PYLORI;  Surgeon: Johnathan Hausen, MD;  Location: [facility];  Service: General;  Laterality: N/A;  . LAPAROSCOPIC GASTRIC SLEEVE RESECTION N/A 06/30/2016   Procedure: LAPAROSCOPIC GASTRIC SLEEVE RESECTION WITH UPPER ENDO;  Surgeon: Johnathan Hausen, MD;  Location: [facility];  Service: General;  Laterality: N/A;  . LEFT HEART CATH AND CORONARY ANGIOGRAPHY N/A 10/10/2016   Procedure: Left Heart Cath and Coronary Angiography;  Surgeon: Wellington Hampshire, MD;  Location: [place] [place] [facility];  Service: Cardiovascular;  Laterality: N/A;  . VASECTOMY  2012    Family History  Problem Relation Age of Onset  . Cancer Father 71       melanoma  . Allergies Father   . High blood pressure Father   . Heart disease Paternal Grandfather   . Heart disease Paternal Grandmother   . COPD Maternal Grandmother        non smoker   . Stroke Maternal Grandfather     No Known Allergies  Current Outpatient Medications on File Prior to Visit  Medication Sig Dispense Refill  . aspirin EC 81 MG tablet Take 81 mg by mouth daily.    . Cholecalciferol (VITAMIN D) 2000 units CAPS Take 2,000 Units by mouth daily.    Marland Kitchen OVER THE COUNTER MEDICATION Take 2 tablets by mouth daily. OTC Bariatric Multivitamin post-bariatric surgery     No current facility-administered medications on file prior to visit.     BP 126/72   Temp 98 F (36.7  C)   Ht 6' 1.75" (1.873 m)   Wt 256 lb (116.1 kg)   BMI 33.09 kg/m       Objective:   Physical Exam Vitals signs and nursing note reviewed.  Constitutional:      General: He is not in acute distress.    Appearance: Normal appearance. He is obese. He is not diaphoretic.  HENT:     Head: Normocephalic and atraumatic.     Right Ear: Tympanic membrane, ear canal and external ear normal. There is no impacted cerumen.     Left  Ear: Tympanic membrane, ear canal and external ear normal. There is no impacted cerumen.     Nose: Nose normal. No congestion or rhinorrhea.     Mouth/Throat:     Mouth: Mucous membranes are dry.     Pharynx: Oropharynx is clear. No oropharyngeal exudate or posterior oropharyngeal erythema.  Eyes:     General: No scleral icterus.       Right eye: No discharge.        Left eye: No discharge.     Extraocular Movements: Extraocular movements intact.     Conjunctiva/sclera: Conjunctivae normal.     Pupils: Pupils are equal, round, and reactive to light.  Neck:     Musculoskeletal: Normal range of motion and neck supple. No neck rigidity or muscular tenderness.     Thyroid: No thyromegaly.     Vascular: No JVD.     Trachea: No tracheal deviation.  Cardiovascular:     Rate and Rhythm: Normal rate and regular rhythm.     Pulses: Normal pulses.     Heart sounds: Normal heart sounds. No murmur. No friction rub. No gallop.   Pulmonary:     Effort: Pulmonary effort is normal. No respiratory distress.     Breath sounds: Normal breath sounds. No stridor. No wheezing, rhonchi or rales.  Chest:     Chest wall: No tenderness.  Abdominal:     General: Abdomen is flat. Bowel sounds are normal. There is no distension.     Palpations: Abdomen is soft. There is no mass.     Tenderness: There is no abdominal tenderness. There is no right CVA tenderness, left CVA tenderness, guarding or rebound.     Hernia: No hernia is present.  Musculoskeletal: Normal range of motion.         General: No swelling, tenderness, deformity or signs of injury.     Right lower leg: No edema.     Left lower leg: No edema.  Lymphadenopathy:     Cervical: No cervical adenopathy.  Skin:    General: Skin is warm and dry.     Capillary Refill: Capillary refill takes less than 2 seconds.     Coloration: Skin is not jaundiced or pale.     Findings: No bruising, erythema, lesion or rash.  Neurological:     General: No focal deficit present.     Mental Status: He is alert and oriented to person, place, and time. Mental status is at baseline.     Cranial Nerves: No cranial nerve deficit.     Sensory: No sensory deficit.     Motor: No weakness or abnormal muscle tone.     Coordination: Coordination normal.     Gait: Gait normal.     Deep Tendon Reflexes: Reflexes are normal and symmetric. Reflexes normal.  Psychiatric:        Mood and Affect: Mood normal.        Behavior: Behavior normal.        Thought Content: Thought content normal.        Judgment: Judgment normal.        Assessment & Plan:  1. Routine general medical examination at a health care facility - Encouraged weight loss.  - Follow up in one year or sooner if needed - CBC with Differential/Platelet - Comprehensive metabolic panel - Hemoglobin A1c - Lipid panel - TSH  2. Coronary artery disease, non-occlusive - Consider statin  - CBC with Differential/Platelet - Comprehensive metabolic panel - Hemoglobin A1c -  Lipid panel - TSH  3. S/P laparoscopic sleeve gastrectomy March 2018 - Needs to work on weight loss.  - Follow up as directed yearly  - CBC with Differential/Platelet - Comprehensive metabolic panel - Hemoglobin A1c - Lipid panel - TSH - Vitamin D, 25-hydroxy - Vitamin A - Folate RBC - Vitamin B12 - PTH, Intact and Calcium - IBC + Ferritin  4. Other obesity - Encouraged weight loss through diet and exercise  - CBC with Differential/Platelet - Comprehensive metabolic panel - Hemoglobin  A1c - Lipid panel - TSH  5. Glucose intolerance - Consider metformin  - CBC with Differential/Platelet - Comprehensive metabolic panel - Hemoglobin A1c - Lipid panel - TSH  6. Prostate cancer screening  - PSA  7. Need for tetanus, diphtheria, and acellular pertussis (Tdap) vaccine  - Tdap vaccine greater than or equal to 7yo IM  [employer]

## 2018-11-03 NOTE — Patient Instructions (Signed)
It was great seeing you today   We will follow up with you regarding your labs   Continue to work on lifestyle modifications to help lose weight   We will see you back in one year or sooner if needed  Health Maintenance, Male A healthy lifestyle and preventative care can promote health and wellness.  Maintain regular health, dental, and eye exams.  Eat a healthy diet. Foods like vegetables, fruits, whole grains, low-fat dairy products, and lean protein foods contain the nutrients you need and are low in calories. Decrease your intake of foods high in solid fats, added sugars, and salt. Get information about a proper diet from your health care provider, if necessary.  Regular physical exercise is one of the most important things you can do for your health. Most adults should get at least 150 minutes of moderate-intensity exercise (any activity that increases your heart rate and causes you to sweat) each week. In addition, most adults need muscle-strengthening exercises on 2 or more days a week.   Maintain a healthy weight. The body mass index (BMI) is a screening tool to identify possible weight problems. It provides an estimate of body fat based on height and weight. Your health care provider can find your BMI and can help you achieve or maintain a healthy weight. For males 20 years and older:  A BMI below 18.5 is considered underweight.  A BMI of 18.5 to 24.9 is normal.  A BMI of 25 to 29.9 is considered overweight.  A BMI of 30 and above is considered obese.  Maintain normal blood lipids and cholesterol by exercising and minimizing your intake of saturated fat. Eat a balanced diet with plenty of fruits and vegetables. Blood tests for lipids and cholesterol should begin at age 51 and be repeated every 5 years. If your lipid or cholesterol levels are high, you are over age 83, or you are at high risk for heart disease, you may need your cholesterol levels checked more frequently.Ongoing  high lipid and cholesterol levels should be treated with medicines if diet and exercise are not working.  If you smoke, find out from your health care provider how to quit. If you do not use tobacco, do not start.  Lung cancer screening is recommended for adults aged 39-80 years who are at high risk for developing lung cancer because of a history of smoking. A yearly low-dose CT scan of the lungs is recommended for people who have at least a 30-pack-year history of smoking and are current smokers or have quit within the past 15 years. A pack year of smoking is smoking an average of 1 pack of cigarettes a day for 1 year (for example, a 30-pack-year history of smoking could mean smoking 1 pack a day for 30 years or 2 packs a day for 15 years). Yearly screening should continue until the smoker has stopped smoking for at least 15 years. Yearly screening should be stopped for people who develop a health problem that would prevent them from having lung cancer treatment.  If you choose to drink alcohol, do not have more than 2 drinks per day. One drink is considered to be 12 oz (360 mL) of beer, 5 oz (150 mL) of wine, or 1.5 oz (45 mL) of liquor.  Avoid the use of street drugs. Do not share needles with anyone. Ask for help if you need support or instructions about stopping the use of drugs.  High blood pressure causes heart disease and  increases the risk of stroke. High blood pressure is more likely to develop in:  People who have blood pressure in the end of the normal range (100-139/85-89 mm Hg).  People who are overweight or obese.  People who are African American.  If you are 60-62 years of age, have your blood pressure checked every 3-5 years. If you are 51 years of age or older, have your blood pressure checked every year. You should have your blood pressure measured twice--once when you are at a hospital or clinic, and once when you are not at a hospital or clinic. Record the average of the two  measurements. To check your blood pressure when you are not at a hospital or clinic, you can use:  An automated blood pressure machine at a pharmacy.  A home blood pressure monitor.  If you are 1-33 years old, ask your health care provider if you should take aspirin to prevent heart disease.  Diabetes screening involves taking a blood sample to check your fasting blood sugar level. This should be done once every 3 years after age 61 if you are at a normal weight and without risk factors for diabetes. Testing should be considered at a younger age or be carried out more frequently if you are overweight and have at least 1 risk factor for diabetes.  Colorectal cancer can be detected and often prevented. Most routine colorectal cancer screening begins at the age of 14 and continues through age 10. However, your health care provider may recommend screening at an earlier age if you have risk factors for colon cancer. On a yearly basis, your health care provider may provide home test kits to check for hidden blood in the stool. A small camera at the end of a tube may be used to directly examine the colon (sigmoidoscopy or colonoscopy) to detect the earliest forms of colorectal cancer. Talk to your health care provider about this at age 87 when routine screening begins. A direct exam of the colon should be repeated every 5-10 years through age 60, unless early forms of precancerous polyps or small growths are found.  People who are at an increased risk for hepatitis B should be screened for this virus. You are considered at high risk for hepatitis B if:  You were born in a country where hepatitis B occurs often. Talk with your health care provider about which countries are considered high risk.  Your parents were born in a high-risk country and you have not received a shot to protect against hepatitis B (hepatitis B vaccine).  You have HIV or AIDS.  You use needles to inject street drugs.  You live  with, or have sex with, someone who has hepatitis B.  You are a man who has sex with other men (MSM).  You get hemodialysis treatment.  You take certain medicines for conditions like cancer, organ transplantation, and autoimmune conditions.  Hepatitis C blood testing is recommended for all people born from [birth year] through 1966 and any individual with known risk factors for hepatitis C.  Healthy men should no longer receive prostate-specific antigen (PSA) blood tests as part of routine cancer screening. Talk to your health care provider about prostate cancer screening.  Testicular cancer screening is not recommended for adolescents or adult males who have no symptoms. Screening includes self-exam, a health care provider exam, and other screening tests. Consult with your health care provider about any symptoms you have or any concerns you have about testicular cancer.  Practice safe sex. Use condoms and avoid high-risk sexual practices to reduce the spread of sexually transmitted infections (STIs).  You should be screened for STIs, including gonorrhea and chlamydia if:  You are sexually active and are younger than 24 years.  You are older than 24 years, and your health care provider tells you that you are at risk for this type of infection.  Your sexual activity has changed since you were last screened, and you are at an increased risk for chlamydia or gonorrhea. Ask your health care provider if you are at risk.  If you are at risk of being infected with HIV, it is recommended that you take a prescription medicine daily to prevent HIV infection. This is called pre-exposure prophylaxis (PrEP). You are considered at risk if:  You are a man who has sex with other men (MSM).  You are a heterosexual man who is sexually active with multiple partners.  You take drugs by injection.  You are sexually active with a partner who has HIV.  Talk with your health care provider about whether you are at  high risk of being infected with HIV. If you choose to begin PrEP, you should first be tested for HIV. You should then be tested every 3 months for as long as you are taking PrEP.  Use sunscreen. Apply sunscreen liberally and repeatedly throughout the day. You should seek shade when your shadow is shorter than you. Protect yourself by wearing long sleeves, pants, a wide-brimmed hat, and sunglasses year round whenever you are outdoors.  Tell your health care provider of new moles or changes in moles, especially if there is a change in shape or color. Also, tell your health care provider if a mole is larger than the size of a pencil eraser.  A one-time screening for abdominal aortic aneurysm (AAA) and surgical repair of large AAAs by ultrasound is recommended for men aged 72-75 years who are current or former smokers.  Stay current with your vaccines (immunizations).   This information is not intended to replace advice given to you by your health care provider. Make sure you discuss any questions you have with your health care provider.   Document Released: 09/20/2007 Document Revised: 04/14/2014 Document Reviewed: 08/19/2010 Elsevier Interactive Patient Education [employer].

## 2018-11-04 LAB — TIQ- MISLABELED: Test Ordered On Req: 9214678837

## 2018-11-09 ENCOUNTER — Other Ambulatory Visit: Payer: Self-pay | Admitting: Family Medicine

## 2018-11-09 MED ORDER — SIMVASTATIN 10 MG PO TABS: 10.0000 mg | ORAL_TABLET | Freq: Every day | ORAL | 3 refills | Status: DC

## 2018-11-10 LAB — PTH, INTACT AND CALCIUM
Calcium: 9.5 mg/dL (ref 8.6–10.3)
PTH: 25 pg/mL (ref 14–64)

## 2018-11-10 LAB — VITAMIN A: Vitamin A (Retinoic Acid): 52 ug/dL (ref 38–98)

## 2018-11-10 LAB — FOLATE RBC: RBC Folate: 557 ng/mL RBC (ref >280)

## 2020-04-03 ENCOUNTER — Encounter: Payer: Self-pay | Admitting: Adult Health

## 2020-04-03 ENCOUNTER — Ambulatory Visit (INDEPENDENT_AMBULATORY_CARE_PROVIDER_SITE_OTHER): Payer: 59 | Admitting: Adult Health

## 2020-04-03 ENCOUNTER — Other Ambulatory Visit: Payer: Self-pay

## 2020-04-03 VITALS — BP 112/80 | HR 91 | Temp 98.7°F | Ht 73.5 in | Wt 275.2 lb

## 2020-04-03 DIAGNOSIS — Z9884 Bariatric surgery status: Secondary | ICD-10-CM

## 2020-04-03 DIAGNOSIS — I2510 Atherosclerotic heart disease of native coronary artery without angina pectoris: Secondary | ICD-10-CM | POA: Diagnosis not present

## 2020-04-03 DIAGNOSIS — Z125 Encounter for screening for malignant neoplasm of prostate: Secondary | ICD-10-CM | POA: Diagnosis not present

## 2020-04-03 DIAGNOSIS — Z0000 Encounter for general adult medical examination without abnormal findings: Secondary | ICD-10-CM | POA: Diagnosis not present

## 2020-04-03 DIAGNOSIS — Z1211 Encounter for screening for malignant neoplasm of colon: Secondary | ICD-10-CM

## 2020-04-03 LAB — CBC WITH DIFFERENTIAL/PLATELET
Basophils Absolute: 0 10*3/uL (ref 0.0–0.1)
Basophils Relative: 0.6 % (ref 0.0–3.0)
Eosinophils Absolute: 0.1 10*3/uL (ref 0.0–0.7)
Eosinophils Relative: 1.1 % (ref 0.0–5.0)
HCT: 45.1 % (ref 39.0–52.0)
Hemoglobin: 15.5 g/dL (ref 13.0–17.0)
Lymphocytes Relative: 20.8 % (ref 12.0–46.0)
Lymphs Abs: 1.8 10*3/uL (ref 0.7–4.0)
MCHC: 34.4 g/dL (ref 30.0–36.0)
MCV: 88.3 fl (ref 78.0–100.0)
Monocytes Absolute: 0.6 10*3/uL (ref 0.1–1.0)
Monocytes Relative: 6.6 % (ref 3.0–12.0)
Neutro Abs: 6 10*3/uL (ref 1.4–7.7)
Neutrophils Relative %: 70.9 % (ref 43.0–77.0)
Platelets: 253 10*3/uL (ref 150.0–400.0)
RBC: 5.11 Mil/uL (ref 4.22–5.81)
RDW: 13 % (ref 11.5–15.5)
WBC: 8.4 10*3/uL (ref 4.0–10.5)

## 2020-04-03 LAB — PSA: PSA: 0.4 ng/mL (ref 0.10–4.00)

## 2020-04-03 LAB — LIPID PANEL
Cholesterol: 221 mg/dL — ABNORMAL HIGH (ref 0–200)
HDL: 55.3 mg/dL (ref >39.00)
LDL Cholesterol: 141 mg/dL — ABNORMAL HIGH (ref 0–99)
NonHDL: 165.75
Total CHOL/HDL Ratio: 4
Triglycerides: 125 mg/dL (ref 0.0–149.0)
VLDL: 25 mg/dL (ref 0.0–40.0)

## 2020-04-03 LAB — COMPREHENSIVE METABOLIC PANEL
ALT: 19 U/L (ref 0–53)
AST: 19 U/L (ref 0–37)
Albumin: 4.5 g/dL (ref 3.5–5.2)
Alkaline Phosphatase: 56 U/L (ref 39–117)
BUN: 17 mg/dL (ref 6–23)
CO2: 26 mEq/L (ref 19–32)
Calcium: 9.4 mg/dL (ref 8.4–10.5)
Chloride: 101 mEq/L (ref 96–112)
Creatinine, Ser: 0.92 mg/dL (ref 0.40–1.50)
GFR: 96.85 mL/min (ref >60.00)
Glucose, Bld: 82 mg/dL (ref 70–99)
Potassium: 3.6 mEq/L (ref 3.5–5.1)
Sodium: 136 mEq/L (ref 135–145)
Total Bilirubin: 0.9 mg/dL (ref 0.2–1.2)
Total Protein: 7.4 g/dL (ref 6.0–8.3)

## 2020-04-03 LAB — IRON: Iron: 149 ug/dL (ref 42–165)

## 2020-04-03 LAB — IBC PANEL
Iron: 149 ug/dL (ref 42–165)
Saturation Ratios: 32.4 % (ref 20.0–50.0)
Transferrin: 328 mg/dL (ref 212.0–360.0)

## 2020-04-03 LAB — VITAMIN B12: Vitamin B-12: 296 pg/mL (ref 211–911)

## 2020-04-03 LAB — TSH: TSH: 0.89 u[IU]/mL (ref 0.35–4.50)

## 2020-04-03 LAB — VITAMIN D 25 HYDROXY (VIT D DEFICIENCY, FRACTURES): VITD: 26.3 ng/mL — ABNORMAL LOW (ref 30.00–100.00)

## 2020-04-03 LAB — FERRITIN: Ferritin: 40.1 ng/mL (ref 22.0–322.0)

## 2020-04-03 LAB — HEMOGLOBIN A1C: Hgb A1c MFr Bld: 5.6 % (ref 4.6–6.5)

## 2020-04-03 NOTE — Patient Instructions (Signed)
It was great seeing you today   We will follow up with you regarding your lab work   Dr. Myrtie Neither will contact you to schedule your colonoscopy   Please work on weight reduction

## 2020-04-03 NOTE — Progress Notes (Signed)
Subjective:    Patient ID: Franco Nones, male    DOB: 01-Sep-1970, 49 y.o.   MRN: [id number]  HPI Patient presents for yearly preventative medicine examination. He is a 49 year old male who  has a past medical history of Coronary artery disease, non-occlusive, High cholesterol, Morbid obesity ([place]), and Pre-diabetes.  CAD -history of cardiac cath in July 2018.  This showed mild nonobstructive one-vessel coronary artery disease with 33% ostial left circumflex stenosis.  His EF at this time was 50 to 55% with left ventricular end-diastolic pressure of 13 mmHg.  He takes a daily baby aspirin.  Does not take a statin as his cholesterol panel improved with weight loss after cardiac cath.  In August 2020 he was started on simvastatin 10 mg nightly as his cholesterol panel had increased, which was likely due to increase in his weight. Lab Results  Component Value Date   CHOL 217 (H) 11/03/2018   HDL 52.80 11/03/2018   LDLCALC 139 (H) 11/03/2018   LDLDIRECT 151.2 04/21/2012   TRIG 127.0 11/03/2018   CHOLHDL 4 11/03/2018   Obesity - s/p gasteric sleeve in March 2018. He was placed on Phentermine 37.5 mg by his gastric surgeon about a year ago. He is being seen every 3 months. He is snacking a lot since working from home. He is not exercising. Does not feel as though phentermine is working   [employer] from Last 10 Encounters:  04/03/20 275 lb 3.2 oz (124.8 kg)  11/03/18 256 lb (116.1 kg)  05/04/18 243 lb (110.2 kg)  07/28/17 227 lb (103 kg)  03/05/17 245 lb 8 oz (111.4 kg)  11/06/16 256 lb 8 oz (116.3 kg)  11/03/16 257 lb (116.6 kg)  10/10/16 272 lb (123.4 kg)  09/29/16 272 lb 8 oz (123.6 kg)  09/22/16 271 lb (122.9 kg)   All immunizations and health maintenance protocols were reviewed with the patient and needed orders were placed.  Appropriate screening laboratory values were ordered for the patient including screening of hyperlipidemia, renal function and hepatic function. If  indicated by BPH, a PSA was ordered.  Medication reconciliation,  past medical history, social history, problem list and allergies were reviewed in detail with the patient  Goals were established with regard to weight loss, exercise, and  diet in compliance with medications  He is due for a colonoscopy since turning 49   Review of Systems  Constitutional: Negative.   HENT: Negative.   Eyes: Negative.   Respiratory: Negative.   Cardiovascular: Negative.   Gastrointestinal: Negative.   Endocrine: Negative.   Genitourinary: Negative.   Musculoskeletal: Negative.   Skin: Negative.   Allergic/Immunologic: Negative.   Neurological: Negative.   Hematological: Negative.   Psychiatric/Behavioral: Negative.   All other systems reviewed and are negative.  Past Medical History:  Diagnosis Date   Coronary artery disease, non-occlusive    a. LHC 10/10/2016: oLCx 30%, normal EF, nl LVEDP   High cholesterol    Morbid obesity ([facility])    Pre-diabetes     Social History   Socioeconomic History   Marital status: Married    Spouse name: Not on file   Number of children: 2   Years of education: Not on file   Highest education level: Not on file  Occupational History   Occupation: sales tax specialists  Tobacco Use   Smoking status: Never Smoker   Smokeless tobacco: Never Used  Vaping Use   Vaping Use: Never used  Substance and Sexual Activity  Alcohol use: No    Alcohol/week: 0.0 standard drinks   Drug use: No   Sexual activity: Not on file  Other Topics Concern   Not on file  Social History Narrative   Works for [employer].    Social Determinants of Health   Financial Resource Strain: Not on file  Food Insecurity: Not on file  Transportation Needs: Not on file  Physical Activity: Not on file  Stress: Not on file  Social Connections: Not on file  Intimate Partner Violence: Not on file    Past Surgical History:  Procedure Laterality Date   BREATH  Wyman Songster PYLORI N/A 08/09/2014   Procedure: BREATH TEK Kandis Ban;  Surgeon: Johnathan Hausen, MD;  Location: [facility];  Service: General;  Laterality: N/A;   LAPAROSCOPIC GASTRIC SLEEVE RESECTION N/A 06/30/2016   Procedure: LAPAROSCOPIC GASTRIC SLEEVE RESECTION WITH UPPER ENDO;  Surgeon: Johnathan Hausen, MD;  Location: [facility];  Service: General;  Laterality: N/A;   LEFT HEART CATH AND CORONARY ANGIOGRAPHY N/A 10/10/2016   Procedure: Left Heart Cath and Coronary Angiography;  Surgeon: Wellington Hampshire, MD;  Location: [facility];  Service: Cardiovascular;  Laterality: N/A;   VASECTOMY  2012    Family History  Problem Relation Age of Onset   Cancer Father 52       melanoma   Allergies Father    High blood pressure Father    Heart disease Paternal Grandfather    Heart disease Paternal Grandmother    COPD Maternal Grandmother        non smoker    Stroke Maternal Grandfather     No Known Allergies  Current Outpatient Medications on File Prior to Visit  Medication Sig Dispense Refill   aspirin EC 81 MG tablet Take 81 mg by mouth daily.     Cholecalciferol (VITAMIN D) 2000 units CAPS Take 2,000 Units by mouth daily.     OVER THE COUNTER MEDICATION Take 2 tablets by mouth daily. OTC Bariatric Multivitamin post-bariatric surgery     phentermine 37.5 MG capsule Take 37.5 mg by mouth every morning.     simvastatin (ZOCOR) 10 MG tablet Take 1 tablet (10 mg total) by mouth at bedtime. 90 tablet 3   No current facility-administered medications on file prior to visit.    BP 112/80 (BP Location: Left Arm, Patient Position: Sitting, Cuff Size: Large)    Pulse 91    Temp 98.7 F (37.1 C) (Oral)    Ht 6' 1.5" (1.867 m)    Wt 275 lb 3.2 oz (124.8 kg)    SpO2 97%    BMI 35.82 kg/m       Objective:   Physical Exam Vitals and nursing note reviewed.  Constitutional:      General: He is not in acute distress.    Appearance: Normal appearance. He is well-developed. He is obese.   HENT:     Head: Normocephalic and atraumatic.     Right Ear: Tympanic membrane, ear canal and external ear normal. There is no impacted cerumen.     Left Ear: Tympanic membrane, ear canal and external ear normal. There is no impacted cerumen.     Nose: Nose normal. No congestion or rhinorrhea.     Mouth/Throat:     Mouth: Mucous membranes are moist.     Pharynx: Oropharynx is clear. No oropharyngeal exudate or posterior oropharyngeal erythema.  Eyes:     General:        Right eye: No  discharge.        Left eye: No discharge.     Extraocular Movements: Extraocular movements intact.     Conjunctiva/sclera: Conjunctivae normal.     Pupils: Pupils are equal, round, and reactive to light.  Neck:     Vascular: No carotid bruit.     Trachea: No tracheal deviation.  Cardiovascular:     Rate and Rhythm: Normal rate and regular rhythm.     Pulses: Normal pulses.     Heart sounds: Normal heart sounds. No murmur heard. No friction rub. No gallop.   Pulmonary:     Effort: Pulmonary effort is normal. No respiratory distress.     Breath sounds: Normal breath sounds. No stridor. No wheezing, rhonchi or rales.  Chest:     Chest wall: No tenderness.  Abdominal:     General: Bowel sounds are normal. There is no distension.     Palpations: Abdomen is soft. There is no mass.     Tenderness: There is no abdominal tenderness. There is no right CVA tenderness, left CVA tenderness, guarding or rebound.     Hernia: No hernia is present.  Musculoskeletal:        General: No swelling, tenderness, deformity or signs of injury. Normal range of motion.     Right lower leg: No edema.     Left lower leg: No edema.  Lymphadenopathy:     Cervical: No cervical adenopathy.  Skin:    General: Skin is warm and dry.     Capillary Refill: Capillary refill takes less than 2 seconds.     Coloration: Skin is not jaundiced or pale.     Findings: No bruising, erythema, lesion or rash.  Neurological:     General: No  focal deficit present.     Mental Status: He is alert and oriented to person, place, and time.     Cranial Nerves: No cranial nerve deficit.     Sensory: No sensory deficit.     Motor: No weakness.     Coordination: Coordination normal.     Gait: Gait normal.     Deep Tendon Reflexes: Reflexes normal.  Psychiatric:        Mood and Affect: Mood normal.        Behavior: Behavior normal.        Thought Content: Thought content normal.        Judgment: Judgment normal.       Assessment & Plan:  1. Routine general medical examination at a health care facility - Significant weight reduction needed - Follow up in one year or sooner if needed - CBC with Differential/Platelet; Future - Hemoglobin A1c; Future - Lipid panel; Future - TSH; Future - Comprehensive metabolic panel; Future  2. Coronary artery disease, non-occlusive - Consider increase in statin  - CBC with Differential/Platelet; Future - Hemoglobin A1c; Future - Lipid panel; Future - TSH; Future  3. S/P laparoscopic sleeve gastrectomy March 2018 - Follow up with surgeon as directed - Needs to work on significant weight reduction  - CBC with Differential/Platelet; Future - Hemoglobin A1c; Future - Lipid panel; Future - TSH; Future - VITAMIN D 25 Hydroxy (Vit-D Deficiency, Fractures); Future - Vitamin B12; Future - Iron, TIBC and Ferritin Panel; Future - PTH, Intact and Calcium; Future - Folate RBC; Future - Vitamin A; Future  4. Prostate cancer screening  - PSA; Future  5. Colon cancer screening  - Ambulatory referral to Gastroenterology  Shirline Frees, NP

## 2020-04-05 MED ORDER — ROSUVASTATIN CALCIUM 10 MG PO TABS: 10.0000 mg | ORAL_TABLET | Freq: Every day | ORAL | 1 refills | Status: DC

## 2020-04-05 NOTE — Addendum Note (Signed)
Addended by: Johnella Moloney on: 04/05/2020 03:42 PM   Modules accepted: Orders

## 2020-04-12 ENCOUNTER — Encounter: Payer: Self-pay | Admitting: Gastroenterology

## 2020-04-20 LAB — VITAMIN A

## 2020-04-20 LAB — FOLATE RBC: RBC Folate: 420 ng/mL RBC (ref >280)

## 2020-04-20 LAB — PTH, INTACT AND CALCIUM
Calcium: 9.6 mg/dL (ref 8.6–10.3)
PTH: 38 pg/mL (ref 14–64)

## 2020-05-21 ENCOUNTER — Other Ambulatory Visit: Payer: Self-pay

## 2020-05-22 ENCOUNTER — Ambulatory Visit (INDEPENDENT_AMBULATORY_CARE_PROVIDER_SITE_OTHER): Payer: 59 | Admitting: Gastroenterology

## 2020-05-22 ENCOUNTER — Encounter: Payer: Self-pay | Admitting: Gastroenterology

## 2020-05-22 VITALS — BP 140/72 | HR 89 | Ht 74.0 in | Wt 283.0 lb

## 2020-05-22 DIAGNOSIS — Z1211 Encounter for screening for malignant neoplasm of colon: Secondary | ICD-10-CM

## 2020-05-22 DIAGNOSIS — R142 Eructation: Secondary | ICD-10-CM

## 2020-05-22 MED ORDER — PLENVU 140 G PO SOLR: 140.0000 g | ORAL | 0 refills | Status: DC

## 2020-05-22 NOTE — Progress Notes (Signed)
[facility] Consult Note:  History: Brandon Bailey 05/22/2020  Referring provider: Dorothyann Peng, NP -referred for colon cancer screening.  Patient was self-referred for upper digestive symptoms.  Reason for consult/chief complaint: Colon Cancer Screening (Due for his first one) and Gastroesophageal Reflux (Gagging and coughing upon waking up, onset x years)   Subjective  HPI:  Chart was self-referred at the suggestion of his wife.  For the last few years he has had intermittent morning belching and gagging/coughing soon after awakening, then it passes quickly.  He does not get heartburn or regurgitation and does not have nocturnal awakening with reflux type symptoms.  He denies dysphagia, nausea or vomiting.  Bowel habits are regular without rectal bleeding.  He underwent gastric sleeve procedure in 2018 From most recent primary care note of 04/03/2020: "CAD -history of cardiac cath in July 2018.  This showed mild nonobstructive one-vessel coronary artery disease with 33% ostial left circumflex stenosis.  His EF at this time was 50 to 55% with left ventricular end-diastolic pressure of 13 mmHg.  He takes a daily baby aspirin.  Does not take a statin as his cholesterol panel improved with weight loss after cardiac cath.  In August 2020 he was started on simvastatin 10 mg nightly as his cholesterol panel had increased, which was likely due to increase in his weight  Obesity - s/p gasteric sleeve in March 2018. He was placed on Phentermine 37.5 mg by his gastric surgeon about a year ago. He is being seen every 3 months. He is snacking a lot since working from home. He is not exercising. Does not feel as though phentermine is working"  ROS:  Review of Systems  Constitutional: Negative for appetite change and unexpected weight change.  HENT: Negative for mouth sores and voice change.   Eyes: Negative for pain and redness.  Respiratory: Negative for cough and shortness  of breath.   Cardiovascular: Negative for chest pain and palpitations.  Genitourinary: Negative for dysuria and hematuria.  Musculoskeletal: Negative for arthralgias and myalgias.  Skin: Negative for pallor and rash.  Neurological: Negative for weakness and headaches.  Hematological: Negative for adenopathy.   No change in vocal quality  Past Medical History: Past Medical History:  Diagnosis Date  . Coronary artery disease, non-occlusive    a. LHC 10/10/2016: oLCx 30%, normal EF, nl LVEDP  . High cholesterol   . Morbid obesity ([name])   . Pre-diabetes   . Sleep apnea      Past Surgical History: Past Surgical History:  Procedure Laterality Date  . BREATH TEK H PYLORI N/A 08/09/2014   Procedure: BREATH TEK H PYLORI;  Surgeon: Johnathan Hausen, MD;  Location: [facility];  Service: General;  Laterality: N/A;  . LAPAROSCOPIC GASTRIC SLEEVE RESECTION N/A 06/30/2016   Procedure: LAPAROSCOPIC GASTRIC SLEEVE RESECTION WITH UPPER ENDO;  Surgeon: Johnathan Hausen, MD;  Location: [facility];  Service: General;  Laterality: N/A;  . LEFT HEART CATH AND CORONARY ANGIOGRAPHY N/A 10/10/2016   Procedure: Left Heart Cath and Coronary Angiography;  Surgeon: Wellington Hampshire, MD;  Location: [facility];  Service: Cardiovascular;  Laterality: N/A;  . VASECTOMY  2012     Family History: Family History  Problem Relation Age of Onset  . Cancer Father 54       melanoma  . Allergies Father   . High blood pressure Father   . Heart disease Paternal Grandfather   . Heart disease Paternal Grandmother   . COPD Maternal Grandmother  non smoker   . Stroke Maternal Grandfather     Social History: Social History   Socioeconomic History  . Marital status: Married    Spouse name: Not on file  . Number of children: 2  . Years of education: Not on file  . Highest education level: Not on file  Occupational History  . Occupation: Writer  Tobacco Use  . Smoking status: Never Smoker   . Smokeless tobacco: Never Used  Vaping Use  . Vaping Use: Never used  Substance and Sexual Activity  . Alcohol use: No    Alcohol/week: 0.0 standard drinks  . Drug use: No  . Sexual activity: Not on file  Other Topics Concern  . Not on file  Social History Narrative   Works for [employer].    Social Determinants of Health   Financial Resource Strain: Not on file  Food Insecurity: Not on file  Transportation Needs: Not on file  Physical Activity: Not on file  Stress: Not on file  Social Connections: Not on file    Allergies: No Known Allergies  Outpatient Meds: Current Outpatient Medications  Medication Sig Dispense Refill  . aspirin EC 81 MG tablet Take 81 mg by mouth daily.    . Cholecalciferol (VITAMIN D) 2000 units CAPS Take 2,000 Units by mouth daily.    . Cyanocobalamin (VITAMIN B-12 PO) Take 1 tablet by mouth daily.    . Multiple Vitamins-Minerals (ZINC PO) Take 1 capsule by mouth daily.    Marland Kitchen OVER THE COUNTER MEDICATION Take 2 tablets by mouth daily. OTC Bariatric Multivitamin post-bariatric surgery    . phentermine 37.5 MG capsule Take 37.5 mg by mouth every morning.    . Red Yeast Rice 600 MG CAPS Take 2 capsules by mouth daily.    . vitamin C (ASCORBIC ACID) 500 MG tablet Take 500 mg by mouth daily.     No current facility-administered medications for this visit.      ___________________________________________________________________ Objective   Exam:  BP 140/72   Pulse 89   Ht 6\' 2"  (1.88 m)   Wt 283 lb (128.4 kg)   BMI 36.34 kg/m  Wt Readings from Last 3 Encounters:  05/22/20 283 lb (128.4 kg)  04/03/20 275 lb 3.2 oz (124.8 kg)  11/03/18 256 lb (116.1 kg)     General: Well-appearing, normal vocal quality  Eyes: sclera anicteric, no redness  ENT: oral mucosa moist without lesions, no cervical or supraclavicular lymphadenopathy  CV: RRR without murmur, S1/S2, no JVD, no peripheral edema  Resp: clear to auscultation bilaterally,  normal RR and effort noted  GI: soft, no tenderness, with active bowel sounds. No guarding or palpable organomegaly noted.  Skin; warm and dry, no rash or jaundice noted  Neuro: awake, alert and oriented x 3. Normal gross motor function and fluent speech  Labs:  CBC Latest Ref Rng & Units 04/03/2020 11/03/2018 11/06/2016  WBC 4.0 - 10.5 K/uL 8.4 5.1 6.7  Hemoglobin 13.0 - 17.0 g/dL 15.5 14.7 14.5  Hematocrit 39.0 - 52.0 % 45.1 44.3 45.3  Platelets 150.0 - 400.0 K/uL 253.0 235.0 236   CMP Latest Ref Rng & Units 04/03/2020 04/03/2020 11/03/2018  Glucose 70 - 99 mg/dL 82 - -  BUN 6 - 23 mg/dL 17 - -  Creatinine 0.40 - 1.50 mg/dL 0.92 - -  Sodium 135 - 145 mEq/L 136 - -  Potassium 3.5 - 5.1 mEq/L 3.6 - -  Chloride 96 - 112 mEq/L 101 - -  CO2 19 - 32 mEq/L 26 - -  Calcium 8.4 - 10.5 mg/dL 9.4 9.6 9.5  Total Protein 6.0 - 8.3 g/dL 7.4 - -  Total Bilirubin 0.2 - 1.2 mg/dL 0.9 - -  Alkaline Phos 39 - 117 U/L 56 - -  AST 0 - 37 U/L 19 - -  ALT 0 - 53 U/L 19 - -     Assessment: Encounter Diagnoses  Name Primary?  . Special screening for malignant neoplasms, colon Yes  . Belching     Average risk for colorectal cancer, due for screening colonoscopy.  The other symptoms are somewhat difficult to characterize and not definitely reflux.  He does not seem particular concerned about them, but primarily came at the urging of his wife.  He does not have heartburn or regurgitation. They may be atypical reflux symptoms or perhaps some other dysmotility related to previous gastric sleeve.  He has not needed to use CPAP since the sleeve procedure.  Plan:  Upper endoscopy.  If normal, I think that will be reassuring.  He does not seem to need any acid suppression therapy. Screening colonoscopy.  He was agreeable to both procedures after discussion of risks and benefits.  The benefits and risks of the planned procedure were described in detail with the patient or (when appropriate) their  health care proxy.  Risks were outlined as including, but not limited to, bleeding, infection, perforation, adverse medication reaction leading to cardiac or pulmonary decompensation, pancreatitis (if ERCP).  The limitation of incomplete mucosal visualization was also discussed.  No guarantees or warranties were given.   Thank you for the courtesy of this consult.  Please call me with any questions or concerns.  Nelida Meuse III  CC: Referring provider noted above

## 2020-05-31 ENCOUNTER — Encounter: Payer: Self-pay | Admitting: Adult Health

## 2020-06-14 ENCOUNTER — Telehealth (INDEPENDENT_AMBULATORY_CARE_PROVIDER_SITE_OTHER): Payer: 59 | Admitting: Adult Health

## 2020-06-14 DIAGNOSIS — E668 Other obesity: Secondary | ICD-10-CM | POA: Diagnosis not present

## 2020-06-14 MED ORDER — WEGOVY 0.25 MG/0.5ML ~~LOC~~ SOAJ: 0.2500 mg | SUBCUTANEOUS | 0 refills | Status: DC

## 2020-06-14 NOTE — Progress Notes (Signed)
Virtual Visit via Video Note  I connected with Brandon Bailey on 06/14/20 at 11:30 AM EST by a video enabled telemedicine application and verified that I am speaking with the correct person using two identifiers.  Location patient: home Location provider:work or home office Persons participating in the virtual visit: patient, provider  I discussed the limitations of evaluation and management by telemedicine and the availability of in person appointments. The patient expressed understanding and agreed to proceed.   HPI: 50 year old male history of gastric sleeve in March 2018.  Was placed on phentermine 37.5 mg by his gastric surgeon approximately 18 months ago.  He was being seen every 3 months by gastric surgery but was not seen results with phentermine.  He has talked to his gastric surgeon about using Wegovy for weight loss and his gastric surgeon is okay with primary care prescribing this for him.   ROS: See pertinent positives and negatives per HPI.  Past Medical History:  Diagnosis Date  . Coronary artery disease, non-occlusive    a. LHC 10/10/2016: oLCx 30%, normal EF, nl LVEDP  . High cholesterol   . Morbid obesity ([place])   . Pre-diabetes   . Sleep apnea     Past Surgical History:  Procedure Laterality Date  . BREATH TEK H PYLORI N/A 08/09/2014   Procedure: BREATH TEK H PYLORI;  Surgeon: Johnathan Hausen, MD;  Location: [facility];  Service: General;  Laterality: N/A;  . LAPAROSCOPIC GASTRIC SLEEVE RESECTION N/A 06/30/2016   Procedure: LAPAROSCOPIC GASTRIC SLEEVE RESECTION WITH UPPER ENDO;  Surgeon: Johnathan Hausen, MD;  Location: [facility];  Service: General;  Laterality: N/A;  . LEFT HEART CATH AND CORONARY ANGIOGRAPHY N/A 10/10/2016   Procedure: Left Heart Cath and Coronary Angiography;  Surgeon: Wellington Hampshire, MD;  Location: [facility];  Service: Cardiovascular;  Laterality: N/A;  . VASECTOMY  2012    Family History  Problem Relation Age of Onset  . Cancer  Father 11       melanoma  . Allergies Father   . High blood pressure Father   . Heart disease Paternal Grandfather   . Heart disease Paternal Grandmother   . COPD Maternal Grandmother        non smoker   . Stroke Maternal Grandfather        Current Outpatient Medications:  .  aspirin EC 81 MG tablet, Take 81 mg by mouth daily., Disp: , Rfl:  .  Cholecalciferol (VITAMIN D) 2000 units CAPS, Take 2,000 Units by mouth daily., Disp: , Rfl:  .  Cyanocobalamin (VITAMIN B-12 PO), Take 1 tablet by mouth daily., Disp: , Rfl:  .  Multiple Vitamins-Minerals (ZINC PO), Take 1 capsule by mouth daily., Disp: , Rfl:  .  OVER THE COUNTER MEDICATION, Take 2 tablets by mouth daily. OTC Bariatric Multivitamin post-bariatric surgery, Disp: , Rfl:  .  PEG-KCl-NaCl-NaSulf-Na Asc-C (PLENVU) 140 g SOLR, Take 140 g by mouth as directed. Manufacturer's coupon Universal coupon code:BIN: P2366821; GROUP: [id number]; PCN: [id number]; ID: [id number]; PAY NO MORE $50, Disp: 1 each, Rfl: 0 .  phentermine 37.5 MG capsule, Take 37.5 mg by mouth every morning., Disp: , Rfl:  .  Red Yeast Rice 600 MG CAPS, Take 2 capsules by mouth daily., Disp: , Rfl:  .  vitamin C (ASCORBIC ACID) 500 MG tablet, Take 500 mg by mouth daily., Disp: , Rfl:   EXAM:  VITALS per patient if applicable:  GENERAL: alert, oriented, appears well and in no acute distress  HEENT: atraumatic, conjunttiva clear, no obvious abnormalities on inspection of external nose and ears  NECK: normal movements of the head and neck  LUNGS: on inspection no signs of respiratory distress, breathing rate appears normal, no obvious gross SOB, gasping or wheezing  CV: no obvious cyanosis  MS: moves all visible extremities without noticeable abnormality  PSYCH/NEURO: pleasant and cooperative, no obvious depression or anxiety, speech and thought processing grossly intact  ASSESSMENT AND PLAN:  Discussed the following assessment and plan:  1. Other  obesity -Reviewed side effects with the patient.  Advise follow-up with severe abdominal pain and vomiting due to low risk of pancreatitis.  He does understand that this medication may cause some mild vomiting, constipation, or diarrhea which should resolve after a few doses.  He will follow-up in 1 month and at that point if he is tolerating well we can titrate up to the next dose - Semaglutide-Weight Management (WEGOVY) 0.25 MG/0.5ML SOAJ; Inject 0.25 mg into the skin once a week.  Dispense: 2 mL; Refill: 0      I discussed the assessment and treatment plan with the patient. The patient was provided an opportunity to ask questions and all were answered. The patient agreed with the plan and demonstrated an understanding of the instructions.   The patient was advised to call back or seek an in-person evaluation if the symptoms worsen or if the condition fails to improve as anticipated.   Dorothyann Peng, NP

## 2020-07-06 ENCOUNTER — Encounter: Payer: Self-pay | Admitting: Adult Health

## 2020-07-06 ENCOUNTER — Telehealth (INDEPENDENT_AMBULATORY_CARE_PROVIDER_SITE_OTHER): Payer: 59 | Admitting: Adult Health

## 2020-07-06 DIAGNOSIS — E668 Other obesity: Secondary | ICD-10-CM

## 2020-07-06 DIAGNOSIS — Z713 Dietary counseling and surveillance: Secondary | ICD-10-CM

## 2020-07-06 MED ORDER — WEGOVY 0.5 MG/0.5ML ~~LOC~~ SOAJ
0.5000 mg | SUBCUTANEOUS | 0 refills | Status: DC
Start: 2020-07-06 — End: 2020-08-09

## 2020-07-06 NOTE — Progress Notes (Signed)
Virtual Visit via Video Note  I connected with Brandon Bailey on 07/06/20 at  8:30 AM EDT by a video enabled telemedicine application and verified that I am speaking with the correct person using two identifiers.  Location patient: home Location provider:work or home office Persons participating in the virtual visit: patient, provider  I discussed the limitations of evaluation and management by telemedicine and the availability of in person appointments. The patient expressed understanding and agreed to proceed.   HPI: 50 year old male who is being evaluated today for weight loss management.  1 month ago he was started on Wegovy 0.25 mg weekly.  He reports no side effects since starting this medication.  His appetite has decreased.  He has not really been weighing himself but believes that he has lost some weight.  He feels well overall and would like to continue to the next dose.  Wt Readings from Last 3 Encounters:  05/22/20 283 lb (128.4 kg)  04/03/20 275 lb 3.2 oz (124.8 kg)  11/03/18 256 lb (116.1 kg)    ROS: See pertinent positives and negatives per HPI.  Past Medical History:  Diagnosis Date  . Coronary artery disease, non-occlusive    a. LHC 10/10/2016: oLCx 30%, normal EF, nl LVEDP  . High cholesterol   . Morbid obesity ([place])   . Pre-diabetes   . Sleep apnea     Past Surgical History:  Procedure Laterality Date  . BREATH TEK H PYLORI N/A 08/09/2014   Procedure: BREATH TEK H PYLORI;  Surgeon: Johnathan Hausen, MD;  Location: [facility];  Service: General;  Laterality: N/A;  . LAPAROSCOPIC GASTRIC SLEEVE RESECTION N/A 06/30/2016   Procedure: LAPAROSCOPIC GASTRIC SLEEVE RESECTION WITH UPPER ENDO;  Surgeon: Johnathan Hausen, MD;  Location: [facility];  Service: General;  Laterality: N/A;  . LEFT HEART CATH AND CORONARY ANGIOGRAPHY N/A 10/10/2016   Procedure: Left Heart Cath and Coronary Angiography;  Surgeon: Wellington Hampshire, MD;  Location: [facility];  Service:  Cardiovascular;  Laterality: N/A;  . VASECTOMY  2012    Family History  Problem Relation Age of Onset  . Cancer Father 58       melanoma  . Allergies Father   . High blood pressure Father   . Heart disease Paternal Grandfather   . Heart disease Paternal Grandmother   . COPD Maternal Grandmother        non smoker   . Stroke Maternal Grandfather        Current Outpatient Medications:  .  aspirin EC 81 MG tablet, Take 81 mg by mouth daily., Disp: , Rfl:  .  Cholecalciferol (VITAMIN D) 2000 units CAPS, Take 2,000 Units by mouth daily., Disp: , Rfl:  .  Cyanocobalamin (VITAMIN B-12 PO), Take 1 tablet by mouth daily., Disp: , Rfl:  .  Multiple Vitamins-Minerals (ZINC PO), Take 1 capsule by mouth daily., Disp: , Rfl:  .  OVER THE COUNTER MEDICATION, Take 2 tablets by mouth daily. OTC Bariatric Multivitamin post-bariatric surgery, Disp: , Rfl:  .  PEG-KCl-NaCl-NaSulf-Na Asc-C (PLENVU) 140 g SOLR, Take 140 g by mouth as directed. Manufacturer's coupon Universal coupon code:BIN: P2366821; GROUP: [id number]; PCN: [id number]; ID: [id number]; PAY NO MORE $50, Disp: 1 each, Rfl: 0 .  Red Yeast Rice 600 MG CAPS, Take 2 capsules by mouth daily., Disp: , Rfl:  .  Semaglutide-Weight Management (WEGOVY) 0.25 MG/0.5ML SOAJ, Inject 0.25 mg into the skin once a week., Disp: 2 mL, Rfl: 0 .  vitamin C (ASCORBIC ACID)  500 MG tablet, Take 500 mg by mouth daily., Disp: , Rfl:   EXAM:  VITALS per patient if applicable:  GENERAL: alert, oriented, appears well and in no acute distress  HEENT: atraumatic, conjunttiva clear, no obvious abnormalities on inspection of external nose and ears  NECK: normal movements of the head and neck  LUNGS: on inspection no signs of respiratory distress, breathing rate appears normal, no obvious gross SOB, gasping or wheezing  CV: no obvious cyanosis  MS: moves all visible extremities without noticeable abnormality  PSYCH/NEURO: pleasant and cooperative, no obvious  depression or anxiety, speech and thought processing grossly intact  ASSESSMENT AND PLAN:  Discussed the following assessment and plan:  1. Weight loss counseling, encounter for - Will increase to 0.5 mg dose.  - Follow up in one month or sooner if needed - Semaglutide-Weight Management (WEGOVY) 0.5 MG/0.5ML SOAJ; Inject 0.5 mg into the skin once a week.  Dispense: 2 mL; Refill: 0  2. Other obesity  - Semaglutide-Weight Management (WEGOVY) 0.5 MG/0.5ML SOAJ; Inject 0.5 mg into the skin once a week.  Dispense: 2 mL; Refill: 0     I discussed the assessment and treatment plan with the patient. The patient was provided an opportunity to ask questions and all were answered. The patient agreed with the plan and demonstrated an understanding of the instructions.   The patient was advised to call back or seek an in-person evaluation if the symptoms worsen or if the condition fails to improve as anticipated.   Dorothyann Peng, NP

## 2020-07-27 ENCOUNTER — Other Ambulatory Visit: Payer: Self-pay | Admitting: Gastroenterology

## 2020-07-27 LAB — SARS CORONAVIRUS 2 (TAT 6-24 HRS): SARS Coronavirus 2: NEGATIVE

## 2020-07-31 ENCOUNTER — Ambulatory Visit (AMBULATORY_SURGERY_CENTER): Payer: 59 | Admitting: Gastroenterology

## 2020-07-31 ENCOUNTER — Other Ambulatory Visit: Payer: Self-pay

## 2020-07-31 ENCOUNTER — Encounter: Payer: Self-pay | Admitting: Gastroenterology

## 2020-07-31 VITALS — BP 118/74 | HR 77 | Temp 98.4°F | Resp 12 | Ht 74.0 in | Wt 283.0 lb

## 2020-07-31 DIAGNOSIS — Z1211 Encounter for screening for malignant neoplasm of colon: Secondary | ICD-10-CM

## 2020-07-31 DIAGNOSIS — R142 Eructation: Secondary | ICD-10-CM | POA: Diagnosis not present

## 2020-07-31 MED ORDER — SODIUM CHLORIDE 0.9 % IV SOLN: 500.0000 mL | Freq: Once | INTRAVENOUS | Status: DC

## 2020-07-31 NOTE — Op Note (Signed)
[facility] Patient Name: Brandon Bailey Procedure Date: 07/31/2020 9:40 AM MRN: [id number] Endoscopist: Mallie Mussel L. Loletha Carrow , MD Age: 50 Referring MD:  Date of Birth: 1971/01/14 Gender: Male Account #: [id number] Procedure:                Colonoscopy Indications:              Screening for colorectal malignant neoplasm, This                            is the patient's first colonoscopy Medicines:                Monitored Anesthesia Care Procedure:                Pre-Anesthesia Assessment:                           - Prior to the procedure, a History and Physical                            was performed, and patient medications and                            allergies were reviewed. The patient's tolerance of                            previous anesthesia was also reviewed. The risks                            and benefits of the procedure and the sedation                            options and risks were discussed with the patient.                            All questions were answered, and informed consent                            was obtained. Prior Anticoagulants: The patient has                            taken no previous anticoagulant or antiplatelet                            agents. ASA Grade Assessment: II - A patient with                            mild systemic disease. After reviewing the risks                            and benefits, the patient was deemed in                            satisfactory condition to undergo the procedure.  After obtaining informed consent, the colonoscope                            was passed under direct vision. Throughout the                            procedure, the patient's blood pressure, pulse, and                            oxygen saturations were monitored continuously. The                            Olympus CF-[id number]) Colonoscope was                            introduced through the anus and  advanced to the the                            cecum, identified by appendiceal orifice and                            ileocecal valve. The colonoscopy was performed                            without difficulty. The patient tolerated the                            procedure well. The quality of the bowel                            preparation was excellent. The ileocecal valve,                            appendiceal orifice, and rectum were photographed. Scope In: 9:49:58 AM Scope Out: 10:05:12 AM Scope Withdrawal Time: 0 hours 12 minutes 16 seconds  Total Procedure Duration: 0 hours 15 minutes 14 seconds  Findings:                 The perianal and digital rectal examinations were                            normal.                           A single small-mouthed diverticulum was found in                            the distal ascending colon.                           The exam was otherwise without abnormality on                            direct and retroflexion views. Complications:            No immediate complications. Estimated Blood  Loss:     Estimated blood loss: none. Impression:               - Diverticulosis in the distal ascending colon.                           - The examination was otherwise normal on direct                            and retroflexion views.                           - No specimens collected. Recommendation:           - Patient has a contact number available for                            emergencies. The signs and symptoms of potential                            delayed complications were discussed with the                            patient. Return to normal activities tomorrow.                            Written discharge instructions were provided to the                            patient.                           - Resume previous diet.                           - Continue present medications.                           - Repeat colonoscopy in 10  years for screening                            purposes.                           - See the other procedure note for documentation of                            additional recommendations. Faustino Luecke L. Loletha Carrow, MD 07/31/2020 10:18:28 AM This report has been signed electronically.

## 2020-07-31 NOTE — Progress Notes (Signed)
[id number] 

## 2020-07-31 NOTE — Patient Instructions (Signed)
YOU HAD AN ENDOSCOPIC PROCEDURE TODAY AT [facility]:   Refer to the procedure report that was given to you for any specific questions about what was found during the examination.  If the procedure report does not answer your questions, please call your gastroenterologist to clarify.  If you requested that your care partner not be given the details of your procedure findings, then the procedure report has been included in a sealed envelope for you to review at your convenience later.  YOU SHOULD EXPECT: Some feelings of bloating in the abdomen. Passage of more gas than usual.  Walking can help get rid of the air that was put into your GI tract during the procedure and reduce the bloating. If you had a lower endoscopy (such as a colonoscopy or flexible sigmoidoscopy) you may notice spotting of blood in your stool or on the toilet paper. If you underwent a bowel prep for your procedure, you may not have a normal bowel movement for a few days.  Please Note:  You might notice some irritation and congestion in your nose or some drainage.  This is from the oxygen used during your procedure.  There is no need for concern and it should clear up in a day or so.  SYMPTOMS TO REPORT IMMEDIATELY:   Following lower endoscopy (colonoscopy or flexible sigmoidoscopy):  Excessive amounts of blood in the stool  Significant tenderness or worsening of abdominal pains  Swelling of the abdomen that is new, acute  Fever of 100F or higher   Following upper endoscopy (EGD)  Vomiting of blood or coffee ground material  New chest pain or pain under the shoulder blades  Painful or persistently difficult swallowing  New shortness of breath  Fever of 100F or higher  Black, tarry-looking stools  For urgent or emergent issues, a gastroenterologist can be reached at any hour by calling [phone]. Do not use MyChart messaging for urgent concerns.    DIET:  We do recommend a small meal at first, but  then you may proceed to your regular diet.  Drink plenty of fluids but you should avoid alcoholic beverages for 24 hours.  ACTIVITY:  You should plan to take it easy for the rest of today and you should NOT DRIVE or use heavy machinery until tomorrow (because of the sedation medicines used during the test).    FOLLOW UP: Our staff will call the number listed on your records 48-72 hours following your procedure to check on you and address any questions or concerns that you may have regarding the information given to you following your procedure. If we do not reach you, we will leave a message.  We will attempt to reach you two times.  During this call, we will ask if you have developed any symptoms of COVID 19. If you develop any symptoms (ie: fever, flu-like symptoms, shortness of breath, cough etc.) before then, please call [phone].  If you test positive for Covid 19 in the 2 weeks post procedure, please call and report this information to [place].    If any biopsies were taken you will be contacted by phone or by letter within the next 1-3 weeks.  Please call us at [phone] if you have not heard about the biopsies in 3 weeks.    SIGNATURES/CONFIDENTIALITY: You and/or your care partner have signed paperwork which will be entered into your electronic medical record.  These signatures attest to the fact that that the information above on  your After Visit Summary has been reviewed and is understood.  Full responsibility of the confidentiality of this discharge information lies with you and/or your care-partner.   Resume medications.Repeat  Colonoscopy in 10 years. Information given on Diverticulosis.

## 2020-07-31 NOTE — Op Note (Signed)
[facility] Patient Name: Brandon Bailey Procedure Date: 07/31/2020 9:39 AM MRN: [id number] Endoscopist: Mallie Mussel L. Loletha Carrow , MD Age: 50 Referring MD:  Date of Birth: Jul 31, 1970 Gender: Male Account #: [id number] Procedure:                Upper GI endoscopy Indications:              Eructation (and "gagging", typically occurring                            early AM) Hx of gastric sleeve resection. No                            chronic pyrosis or regurgitation Medicines:                Monitored Anesthesia Care Procedure:                Pre-Anesthesia Assessment:                           - Prior to the procedure, a History and Physical                            was performed, and patient medications and                            allergies were reviewed. The patient's tolerance of                            previous anesthesia was also reviewed. The risks                            and benefits of the procedure and the sedation                            options and risks were discussed with the patient.                            All questions were answered, and informed consent                            was obtained. Prior Anticoagulants: The patient has                            taken no previous anticoagulant or antiplatelet                            agents. ASA Grade Assessment: II - A patient with                            mild systemic disease. After reviewing the risks                            and benefits, the patient was deemed in  satisfactory condition to undergo the procedure.                           After obtaining informed consent, the endoscope was                            passed under direct vision. Throughout the                            procedure, the patient's blood pressure, pulse, and                            oxygen saturations were monitored continuously. The                            Endoscope was introduced  through the mouth, and                            advanced to the second part of duodenum. The upper                            GI endoscopy was accomplished without difficulty.                            The patient tolerated the procedure well. Scope In: Scope Out: Findings:                 The esophagus was normal.                           There is no endoscopic evidence of Barrett's                            esophagus, esophagitis, hiatal hernia or stricture                            in the entire esophagus.                           Evidence of prior gastric sleeve procedure.                           The exam of the stomach was otherwise normal.                           The examined duodenum was normal. Complications:            No immediate complications. Estimated Blood Loss:     Estimated blood loss: none. Impression:               - Normal esophagus.                           - Normal examined duodenum.                           - No specimens collected.  Larynx not well visualized.                           These symptoms are not likely to be from GERD, and                            he also does not have other GERD symptoms.                           Possible benign UGI dysmotility from having had                            gastric sleeve surgery. Recommendation:           - Patient has a contact number available for                            emergencies. The signs and symptoms of potential                            delayed complications were discussed with the                            patient. Return to normal activities tomorrow.                            Written discharge instructions were provided to the                            patient.                           - Resume previous diet.                           - Continue present medications.                           - See the other procedure note for documentation of                             additional recommendations. Decari Duggar L. Loletha Carrow, MD 07/31/2020 10:23:50 AM This report has been signed electronically.

## 2020-07-31 NOTE — Progress Notes (Signed)
Report to PACU, RN, vss, BBS= Clear.  

## 2020-08-02 ENCOUNTER — Telehealth: Payer: Self-pay

## 2020-08-02 NOTE — Telephone Encounter (Signed)
Called [phone] and left a message we tried to reach pt for a follow up call. maw

## 2020-08-09 ENCOUNTER — Encounter: Payer: Self-pay | Admitting: Adult Health

## 2020-08-09 ENCOUNTER — Telehealth (INDEPENDENT_AMBULATORY_CARE_PROVIDER_SITE_OTHER): Payer: 59 | Admitting: Adult Health

## 2020-08-09 VITALS — Wt 278.0 lb

## 2020-08-09 DIAGNOSIS — E668 Other obesity: Secondary | ICD-10-CM | POA: Diagnosis not present

## 2020-08-09 DIAGNOSIS — Z713 Dietary counseling and surveillance: Secondary | ICD-10-CM | POA: Diagnosis not present

## 2020-08-09 MED ORDER — WEGOVY 1.7 MG/0.75ML ~~LOC~~ SOAJ: 1.7000 mg | SUBCUTANEOUS | 0 refills | Status: DC

## 2020-08-09 NOTE — Progress Notes (Signed)
Virtual Visit via Video Note  I connected with Brandon Bailey on 08/09/20 at  7:30 AM EDT by a video enabled telemedicine application and verified that I am speaking with the correct person using two identifiers.  Location patient: home Location provider:work or home office Persons participating in the virtual visit: patient, provider  I discussed the limitations of evaluation and management by telemedicine and the availability of in person appointments. The patient expressed understanding and agreed to proceed.   HPI: 50 year old male who is being evaluated today for 1 month follow-up regarding weight loss management.  Currently using Wegovy 0.5 mg weekly.  With the increase in [place] he has not had any side effects such as abdominal pain, GERD symptoms, nausea, vomiting, or diarrhea.  His appetite has decreased. He does report that his weight loss has been slow going and has only been able to lose approximately 4 pounds over the last couple months on Wegovy.  He is interested in going to the next dose  Wt Readings from Last 3 Encounters:  08/09/20 278 lb (126.1 kg)  07/31/20 283 lb (128.4 kg)  05/22/20 283 lb (128.4 kg)    ROS: See pertinent positives and negatives per HPI.  Past Medical History:  Diagnosis Date  . Coronary artery disease, non-occlusive    a. LHC 10/10/2016: oLCx 30%, normal EF, nl LVEDP  . GERD (gastroesophageal reflux disease)   . High cholesterol   . Morbid obesity ([place])   . Pre-diabetes   . Sleep apnea    h/o, resolved since gastric surgery in 2018    Past Surgical History:  Procedure Laterality Date  . BREATH TEK H PYLORI N/A 08/09/2014   Procedure: BREATH TEK H PYLORI;  Surgeon: Johnathan Hausen, MD;  Location: [facility];  Service: General;  Laterality: N/A;  . COLONOSCOPY    . LAPAROSCOPIC GASTRIC SLEEVE RESECTION N/A 06/30/2016   Procedure: LAPAROSCOPIC GASTRIC SLEEVE RESECTION WITH UPPER ENDO;  Surgeon: Johnathan Hausen, MD;  Location: [facility];  Service:  General;  Laterality: N/A;  . LEFT HEART CATH AND CORONARY ANGIOGRAPHY N/A 10/10/2016   Procedure: Left Heart Cath and Coronary Angiography;  Surgeon: Wellington Hampshire, MD;  Location: [facility];  Service: Cardiovascular;  Laterality: N/A;  . UPPER GASTROINTESTINAL ENDOSCOPY    . VASECTOMY  2012    Family History  Problem Relation Age of Onset  . Cancer Father 90       melanoma  . Allergies Father   . High blood pressure Father   . Heart disease Paternal Grandfather   . Heart disease Paternal Grandmother   . COPD Maternal Grandmother        non smoker   . Stroke Maternal Grandfather   . Colon cancer Neg Hx   . Esophageal cancer Neg Hx   . Rectal cancer Neg Hx   . Stomach cancer Neg Hx        Current Outpatient Medications:  .  aspirin EC 81 MG tablet, Take 81 mg by mouth daily., Disp: , Rfl:  .  cephALEXin (KEFLEX) 500 MG capsule, Take 500 mg by mouth 4 (four) times daily. (Patient not taking: Reported on 07/31/2020), Disp: , Rfl:  .  Cholecalciferol (VITAMIN D) 2000 units CAPS, Take 2,000 Units by mouth daily., Disp: , Rfl:  .  Cyanocobalamin (VITAMIN B-12 PO), Take 1 tablet by mouth daily., Disp: , Rfl:  .  Multiple Vitamins-Minerals (ZINC PO), Take 1 capsule by mouth daily., Disp: , Rfl:  .  OVER  THE COUNTER MEDICATION, Take 2 tablets by mouth daily. OTC Bariatric Multivitamin post-bariatric surgery, Disp: , Rfl:  .  Red Yeast Rice 600 MG CAPS, Take 2 capsules by mouth daily., Disp: , Rfl:  .  Semaglutide-Weight Management (WEGOVY) 0.5 MG/0.5ML SOAJ, Inject 0.5 mg into the skin once a week., Disp: 2 mL, Rfl: 0 .  vitamin C (ASCORBIC ACID) 500 MG tablet, Take 500 mg by mouth daily., Disp: , Rfl:   EXAM:  VITALS per patient if applicable:  GENERAL: alert, oriented, appears well and in no acute distress  HEENT: atraumatic, conjunttiva clear, no obvious abnormalities on inspection of external nose and ears  NECK: normal movements of the head and neck  LUNGS: on  inspection no signs of respiratory distress, breathing rate appears normal, no obvious gross SOB, gasping or wheezing  CV: no obvious cyanosis  MS: moves all visible extremities without noticeable abnormality  PSYCH/NEURO: pleasant and cooperative, no obvious depression or anxiety, speech and thought processing grossly intact  ASSESSMENT AND PLAN:  Discussed the following assessment and plan:  1. Weight loss counseling, encounter for -We discussed the small shortage of Wegovy in the 1 mg dose.  He is okay with increasing to the 1.7 mg dose weekly.  He understands that increasing may cause side effects such as abdominal pain, diarrhea, vomiting, and severe acid indigestion.  He will stop the medication if any of this presents itself.  If all goes well he will follow-up in 1 month - Semaglutide-Weight Management (WEGOVY) 1.7 MG/0.75ML SOAJ; Inject 1.7 mg into the skin once a week.  Dispense: 3 mL; Refill: 0  2. Other obesity  - Semaglutide-Weight Management (WEGOVY) 1.7 MG/0.75ML SOAJ; Inject 1.7 mg into the skin once a week.  Dispense: 3 mL; Refill: 0     I discussed the assessment and treatment plan with the patient. The patient was provided an opportunity to ask questions and all were answered. The patient agreed with the plan and demonstrated an understanding of the instructions.   The patient was advised to call back or seek an in-person evaluation if the symptoms worsen or if the condition fails to improve as anticipated.   Dorothyann Peng, NP

## 2020-09-05 ENCOUNTER — Ambulatory Visit: Payer: 59 | Admitting: Adult Health

## 2020-09-06 ENCOUNTER — Telehealth (INDEPENDENT_AMBULATORY_CARE_PROVIDER_SITE_OTHER): Payer: 59 | Admitting: Adult Health

## 2020-09-06 ENCOUNTER — Encounter: Payer: Self-pay | Admitting: Adult Health

## 2020-09-06 ENCOUNTER — Ambulatory Visit: Payer: 59 | Admitting: Adult Health

## 2020-09-06 VITALS — Wt 275.0 lb

## 2020-09-06 DIAGNOSIS — Z713 Dietary counseling and surveillance: Secondary | ICD-10-CM

## 2020-09-06 DIAGNOSIS — E668 Other obesity: Secondary | ICD-10-CM | POA: Diagnosis not present

## 2020-09-06 MED ORDER — WEGOVY 2.4 MG/0.75ML ~~LOC~~ SOAJ: 2.4000 mg | SUBCUTANEOUS | 0 refills | Status: DC

## 2020-09-06 NOTE — Progress Notes (Signed)
Virtual Visit via Video Note  I connected with Brandon Bailey on 09/06/20 at  7:30 AM EDT by a video enabled telemedicine application and verified that I am speaking with the correct person using two identifiers.  Location patient: home Location provider:work or home office Persons participating in the virtual visit: patient, provider  I discussed the limitations of evaluation and management by telemedicine and the availability of in person appointments. The patient expressed understanding and agreed to proceed.   HPI: 50 year old male who  has a past medical history of Coronary artery disease, non-occlusive, GERD (gastroesophageal reflux disease), High cholesterol, Morbid obesity ([place]), Pre-diabetes, and Sleep apnea.  He presents to the office today for 1 month regarding weight loss counseling.  During the last visit his Mancel Pars[place] was increased to 1.7 mg weekly he reports that he did well with this medication and had no adverse side effects. He has noticed that his appetite has decreased significantly. He is walking " a little bit"   Wt Readings from Last 3 Encounters:  09/06/20 275 lb (124.7 kg)  08/09/20 278 lb (126.1 kg)  07/31/20 283 lb (128.4 kg)    ROS: See pertinent positives and negatives per HPI.  Past Medical History:  Diagnosis Date  . Coronary artery disease, non-occlusive    a. LHC 10/10/2016: oLCx 30%, normal EF, nl LVEDP  . GERD (gastroesophageal reflux disease)   . High cholesterol   . Morbid obesity ([place])   . Pre-diabetes   . Sleep apnea    h/o, resolved since gastric surgery in 2018    Past Surgical History:  Procedure Laterality Date  . BREATH TEK H PYLORI N/A 08/09/2014   Procedure: BREATH TEK H PYLORI;  Surgeon: Johnathan Hausen, MD;  Location: [facility];  Service: General;  Laterality: N/A;  . COLONOSCOPY    . LAPAROSCOPIC GASTRIC SLEEVE RESECTION N/A 06/30/2016   Procedure: LAPAROSCOPIC GASTRIC SLEEVE RESECTION WITH UPPER ENDO;  Surgeon: Johnathan Hausen, MD;   Location: [facility];  Service: General;  Laterality: N/A;  . LEFT HEART CATH AND CORONARY ANGIOGRAPHY N/A 10/10/2016   Procedure: Left Heart Cath and Coronary Angiography;  Surgeon: Wellington Hampshire, MD;  Location: [facility];  Service: Cardiovascular;  Laterality: N/A;  . UPPER GASTROINTESTINAL ENDOSCOPY    . VASECTOMY  2012    Family History  Problem Relation Age of Onset  . Cancer Father 45       melanoma  . Allergies Father   . High blood pressure Father   . Heart disease Paternal Grandfather   . Heart disease Paternal Grandmother   . COPD Maternal Grandmother        non smoker   . Stroke Maternal Grandfather   . Colon cancer Neg Hx   . Esophageal cancer Neg Hx   . Rectal cancer Neg Hx   . Stomach cancer Neg Hx        Current Outpatient Medications:  .  aspirin EC 81 MG tablet, Take 81 mg by mouth daily., Disp: , Rfl:  .  Cholecalciferol (VITAMIN D) 2000 units CAPS, Take 2,000 Units by mouth daily., Disp: , Rfl:  .  Cyanocobalamin (VITAMIN B-12 PO), Take 1 tablet by mouth daily., Disp: , Rfl:  .  Multiple Vitamins-Minerals (ZINC PO), Take 1 capsule by mouth daily., Disp: , Rfl:  .  OVER THE COUNTER MEDICATION, Take 2 tablets by mouth daily. OTC Bariatric Multivitamin post-bariatric surgery, Disp: , Rfl:  .  Red Yeast Rice 600 MG CAPS,  Take 2 capsules by mouth daily., Disp: , Rfl:  .  vitamin C (ASCORBIC ACID) 500 MG tablet, Take 500 mg by mouth daily., Disp: , Rfl:   EXAM:  VITALS per patient if applicable:  GENERAL: alert, oriented, appears well and in no acute distress  HEENT: atraumatic, conjunttiva clear, no obvious abnormalities on inspection of external nose and ears  NECK: normal movements of the head and neck  LUNGS: on inspection no signs of respiratory distress, breathing rate appears normal, no obvious gross SOB, gasping or wheezing  CV: no obvious cyanosis  MS: moves all visible extremities without noticeable abnormality  PSYCH/NEURO:  pleasant and cooperative, no obvious depression or anxiety, speech and thought processing grossly intact  ASSESSMENT AND PLAN:  Discussed the following assessment and plan:  1. Weight loss counseling, encounter for - Increase dose to maintenance dose of 2.4 mg weekly - Encouraged to increase aerobic exercise - Follow up 30 days or sooner if needed - Semaglutide-Weight Management (WEGOVY) 2.4 MG/0.75ML SOAJ; Inject 2.4 mg into the skin once a week.  Dispense: 3 mL; Refill: 0  2. Other obesity      I discussed the assessment and treatment plan with the patient. The patient was provided an opportunity to ask questions and all were answered. The patient agreed with the plan and demonstrated an understanding of the instructions.   The patient was advised to call back or seek an in-person evaluation if the symptoms worsen or if the condition fails to improve as anticipated.   Dorothyann Peng, NP

## 2020-10-09 ENCOUNTER — Telehealth (INDEPENDENT_AMBULATORY_CARE_PROVIDER_SITE_OTHER): Payer: 59 | Admitting: Adult Health

## 2020-10-09 ENCOUNTER — Encounter: Payer: Self-pay | Admitting: Adult Health

## 2020-10-09 VITALS — Ht 74.0 in | Wt 267.0 lb

## 2020-10-09 DIAGNOSIS — Z713 Dietary counseling and surveillance: Secondary | ICD-10-CM

## 2020-10-09 MED ORDER — WEGOVY 2.4 MG/0.75ML ~~LOC~~ SOAJ: 2.4000 mg | SUBCUTANEOUS | 2 refills | Status: DC

## 2020-10-09 NOTE — Progress Notes (Signed)
Virtual Visit via Video Note  I connected with Brandon Bailey on 10/09/20 at  4:00 PM EDT by a video enabled telemedicine application and verified that I am speaking with the correct person using two identifiers.  Location patient: home Location provider:work or home office Persons participating in the virtual visit: patient, provider  I discussed the limitations of evaluation and management by telemedicine and the availability of in person appointments. The patient expressed understanding and agreed to proceed.   HPI: 50 year old male who  has a past medical history of Coronary artery disease, non-occlusive, GERD (gastroesophageal reflux disease), High cholesterol, Morbid obesity ([place]), Pre-diabetes, and Sleep apnea.  He presents to the office today for follow up regarding weight loss management. During his last visit Wegovy was increased to 2.4 mg weekly. He has not had any side effects since increasing his dose. He continues to feel as though the medication curbs his appetite.  He continues to walk for exercise.  Has been able to lose roughly 8 to 9 pounds over the last month.  This has been his biggest drop since starting Wegovy.  He would like to continue on the maintenance dose. Wt Readings from Last 3 Encounters:  10/09/20 267 lb (121.1 kg)  09/06/20 275 lb (124.7 kg)  08/09/20 278 lb (126.1 kg)    ROS: See pertinent positives and negatives per HPI.  Past Medical History:  Diagnosis Date   Coronary artery disease, non-occlusive    a. LHC 10/10/2016: oLCx 30%, normal EF, nl LVEDP   GERD (gastroesophageal reflux disease)    High cholesterol    Morbid obesity ([facility])    Pre-diabetes    Sleep apnea    h/o, resolved since gastric surgery in 2018    Past Surgical History:  Procedure Laterality Date   BREATH TEK H PYLORI N/A 08/09/2014   Procedure: BREATH TEK H PYLORI;  Surgeon: Johnathan Hausen, MD;  Location: [facility];  Service: General;  Laterality: N/A;   COLONOSCOPY      LAPAROSCOPIC GASTRIC SLEEVE RESECTION N/A 06/30/2016   Procedure: LAPAROSCOPIC GASTRIC SLEEVE RESECTION WITH UPPER ENDO;  Surgeon: Johnathan Hausen, MD;  Location: [facility];  Service: General;  Laterality: N/A;   LEFT HEART CATH AND CORONARY ANGIOGRAPHY N/A 10/10/2016   Procedure: Left Heart Cath and Coronary Angiography;  Surgeon: Wellington Hampshire, MD;  Location: [facility];  Service: Cardiovascular;  Laterality: N/A;   UPPER GASTROINTESTINAL ENDOSCOPY     VASECTOMY  2012    Family History  Problem Relation Age of Onset   Cancer Father 110       melanoma   Allergies Father    High blood pressure Father    Heart disease Paternal Grandfather    Heart disease Paternal Grandmother    COPD Maternal Grandmother        non smoker    Stroke Maternal Grandfather    Colon cancer Neg Hx    Esophageal cancer Neg Hx    Rectal cancer Neg Hx    Stomach cancer Neg Hx        Current Outpatient Medications:    aspirin EC 81 MG tablet, Take 81 mg by mouth daily., Disp: , Rfl:    Cholecalciferol (VITAMIN D) 2000 units CAPS, Take 2,000 Units by mouth daily., Disp: , Rfl:    Cyanocobalamin (VITAMIN B-12 PO), Take 1 tablet by mouth daily., Disp: , Rfl:    Multiple Vitamins-Minerals (ZINC PO), Take 1 capsule by mouth daily., Disp: , Rfl:  OVER THE COUNTER MEDICATION, Take 2 tablets by mouth daily. OTC Bariatric Multivitamin post-bariatric surgery, Disp: , Rfl:    Red Yeast Rice 600 MG CAPS, Take 2 capsules by mouth daily., Disp: , Rfl:    Semaglutide-Weight Management (WEGOVY) 2.4 MG/0.75ML SOAJ, Inject 2.4 mg into the skin once a week., Disp: 3 mL, Rfl: 0   vitamin C (ASCORBIC ACID) 500 MG tablet, Take 500 mg by mouth daily., Disp: , Rfl:   EXAM:  VITALS per patient if applicable:  GENERAL: alert, oriented, appears well and in no acute distress  HEENT: atraumatic, conjunttiva clear, no obvious abnormalities on inspection of external nose and ears  NECK: normal movements of the head and  neck  LUNGS: on inspection no signs of respiratory distress, breathing rate appears normal, no obvious gross SOB, gasping or wheezing  CV: no obvious cyanosis  MS: moves all visible extremities without noticeable abnormality  PSYCH/NEURO: pleasant and cooperative, no obvious depression or anxiety, speech and thought processing grossly intact  ASSESSMENT AND PLAN:  Discussed the following assessment and plan:  1. Weight loss counseling, encounter for - Continue on Wegovy 2.4 mg weekly.  - Increase exercise - Follow up in 3 months or sooner if needed - Semaglutide-Weight Management (WEGOVY) 2.4 MG/0.75ML SOAJ; Inject 2.4 mg into the skin once a week.  Dispense: 3 mL; Refill: 2      I discussed the assessment and treatment plan with the patient. The patient was provided an opportunity to ask questions and all were answered. The patient agreed with the plan and demonstrated an understanding of the instructions.   The patient was advised to call back or seek an in-person evaluation if the symptoms worsen or if the condition fails to improve as anticipated.   Dorothyann Peng, NP

## 2020-12-24 ENCOUNTER — Other Ambulatory Visit: Payer: Self-pay

## 2020-12-25 ENCOUNTER — Telehealth (INDEPENDENT_AMBULATORY_CARE_PROVIDER_SITE_OTHER): Payer: 59 | Admitting: Adult Health

## 2020-12-25 ENCOUNTER — Encounter: Payer: Self-pay | Admitting: Adult Health

## 2020-12-25 ENCOUNTER — Ambulatory Visit: Payer: 59 | Admitting: Adult Health

## 2020-12-25 VITALS — HR 112 | Temp 96.0°F | Ht 74.0 in | Wt 249.0 lb

## 2020-12-25 DIAGNOSIS — B349 Viral infection, unspecified: Secondary | ICD-10-CM

## 2020-12-25 DIAGNOSIS — Z713 Dietary counseling and surveillance: Secondary | ICD-10-CM

## 2020-12-25 MED ORDER — WEGOVY 2.4 MG/0.75ML ~~LOC~~ SOAJ
2.4000 mg | SUBCUTANEOUS | 0 refills | Status: DC
Start: 2020-12-25 — End: 2021-03-19

## 2020-12-25 NOTE — Addendum Note (Signed)
Addended by: Apolinar Junes on: 12/25/2020 09:56 AM   Modules accepted: Level of Service

## 2020-12-25 NOTE — Progress Notes (Signed)
Virtual Visit via Video Note  I connected with Brandon Bailey on 12/25/20 at  9:30 AM EDT by a video enabled telemedicine application and verified that I am speaking with the correct person using two identifiers.  Location patient: home Location provider:work or home office Persons participating in the virtual visit: patient, provider  I discussed the limitations of evaluation and management by telemedicine and the availability of in person appointments. The patient expressed understanding and agreed to proceed.   HPI: 50 year old old male who  has a past medical history of Coronary artery disease, non-occlusive, GERD (gastroesophageal reflux disease), High cholesterol, Morbid obesity ([place]), Pre-diabetes, and Sleep apnea.  He presents to the office today for follow-up regarding weight loss counseling.  He is currently prescribed Wegovy 2.4 mg weekly, has been on this dose for the last few months.  He had trouble losing weight with the starting doses but reports that since he is moved up to the 2.4 mg dose he has lost about 18 pounds over the last 2-1/2 months.  He is feeling well overall and does notice that his clothes are fitting much looser.  He denies any side effects of the medication  Wt Readings from Last 3 Encounters:  12/25/20 249 lb (112.9 kg)  10/09/20 267 lb (121.1 kg)  09/06/20 275 lb (124.7 kg)   Additionally, he reports that he woke up this morning he had body aches, backache, chills, headache, nasal congestion.  He took over-the-counter NSAIDs this morning which have helped resolve symptoms such as body aches, backache, and chills.  Continues to have nasal congestion and headache.  He has not taken a COVID test yet but plans to do so   ROS: See pertinent positives and negatives per HPI.  Past Medical History:  Diagnosis Date   Coronary artery disease, non-occlusive    a. LHC 10/10/2016: oLCx 30%, normal EF, nl LVEDP   GERD (gastroesophageal reflux disease)    High  cholesterol    Morbid obesity (HCC)    Pre-diabetes    Sleep apnea    h/o, resolved since gastric surgery in 2018    Past Surgical History:  Procedure Laterality Date   BREATH TEK H PYLORI N/A 08/09/2014   Procedure: BREATH TEK H PYLORI;  Surgeon: Johnathan Hausen, MD;  Location: [facility];  Service: General;  Laterality: N/A;   COLONOSCOPY     LAPAROSCOPIC GASTRIC SLEEVE RESECTION N/A 06/30/2016   Procedure: LAPAROSCOPIC GASTRIC SLEEVE RESECTION WITH UPPER ENDO;  Surgeon: Johnathan Hausen, MD;  Location: [facility];  Service: General;  Laterality: N/A;   LEFT HEART CATH AND CORONARY ANGIOGRAPHY N/A 10/10/2016   Procedure: Left Heart Cath and Coronary Angiography;  Surgeon: Wellington Hampshire, MD;  Location: [facility];  Service: Cardiovascular;  Laterality: N/A;   UPPER GASTROINTESTINAL ENDOSCOPY     VASECTOMY  2012    Family History  Problem Relation Age of Onset   Cancer Father 67       melanoma   Allergies Father    High blood pressure Father    Heart disease Paternal Grandfather    Heart disease Paternal Grandmother    COPD Maternal Grandmother        non smoker    Stroke Maternal Grandfather    Colon cancer Neg Hx    Esophageal cancer Neg Hx    Rectal cancer Neg Hx    Stomach cancer Neg Hx        Current Outpatient Medications:    aspirin  EC 81 MG tablet, Take 81 mg by mouth daily., Disp: , Rfl:    Cholecalciferol (VITAMIN D) 2000 units CAPS, Take 2,000 Units by mouth daily., Disp: , Rfl:    Cyanocobalamin (VITAMIN B-12 PO), Take 1 tablet by mouth daily., Disp: , Rfl:    Multiple Vitamins-Minerals (ZINC PO), Take 1 capsule by mouth daily., Disp: , Rfl:    OVER THE COUNTER MEDICATION, Take 2 tablets by mouth daily. OTC Bariatric Multivitamin post-bariatric surgery, Disp: , Rfl:    Red Yeast Rice 600 MG CAPS, Take 2 capsules by mouth daily., Disp: , Rfl:    Semaglutide-Weight Management (WEGOVY) 2.4 MG/0.75ML SOAJ, Inject 2.4 mg into the skin once a week., Disp: 3  mL, Rfl: 2   vitamin C (ASCORBIC ACID) 500 MG tablet, Take 500 mg by mouth daily., Disp: , Rfl:   EXAM:  VITALS per patient if applicable:  GENERAL: alert, oriented, appears well and in no acute distress  HEENT: atraumatic, conjunttiva clear, no obvious abnormalities on inspection of external nose and ears  NECK: normal movements of the head and neck  LUNGS: on inspection no signs of respiratory distress, breathing rate appears normal, no obvious gross SOB, gasping or wheezing  CV: no obvious cyanosis  MS: moves all visible extremities without noticeable abnormality  PSYCH/NEURO: pleasant and cooperative, no obvious depression or anxiety, speech and thought processing grossly intact  ASSESSMENT AND PLAN:  Discussed the following assessment and plan:  1. Viral illness -Concern for COVID-19.  He will take at home COVID test.  If it is positive he will follow-up with me.  2. Weight loss counseling, encounter for - Continue to work on weight loss with lifestyle modifications in conjunction with medication.  - Follow up in three months  - Semaglutide-Weight Management (WEGOVY) 2.4 MG/0.75ML SOAJ; Inject 2.4 mg into the skin once a week. Inject 2.4 mg into the skin once a week.  Dispense: 9 mL; Refill: 0      I discussed the assessment and treatment plan with the patient. The patient was provided an opportunity to ask questions and all were answered. The patient agreed with the plan and demonstrated an understanding of the instructions.   The patient was advised to call back or seek an in-person evaluation if the symptoms worsen or if the condition fails to improve as anticipated.   Dorothyann Peng, NP

## 2020-12-26 ENCOUNTER — Other Ambulatory Visit: Payer: Self-pay | Admitting: Adult Health

## 2020-12-26 MED ORDER — MOLNUPIRAVIR EUA 200MG CAPSULE: 4.0000 | ORAL_CAPSULE | Freq: Two times a day (BID) | ORAL | 0 refills | Status: AC

## 2020-12-26 MED ORDER — PREDNISONE 10 MG PO TABS: 10.0000 mg | ORAL_TABLET | Freq: Every day | ORAL | 0 refills | Status: DC

## 2020-12-26 NOTE — Telephone Encounter (Signed)
Pt had virtual visit with PCP on 9/20.  Would like meds sent to [facility] on file.

## 2021-01-01 ENCOUNTER — Ambulatory Visit: Payer: 59 | Admitting: Adult Health

## 2021-02-12 ENCOUNTER — Encounter: Payer: Self-pay | Admitting: Adult Health

## 2021-03-13 ENCOUNTER — Telehealth: Payer: Self-pay

## 2021-03-13 NOTE — Telephone Encounter (Signed)
Brandon Bailey ([place]) IYMEBR 2.4MG /0.75ML auto-injectors   Form [employer] Electronic PA Form [phone] NCPDP) Created 8 minutes ago Sent to Plan 5 minutes ago Plan Response 4 minutes ago Submit Clinical Questions 2 minutes ago Determination Wait for Determination Please wait for [employer] NCPDP 2017 to return a determination.

## 2021-03-14 NOTE — Telephone Encounter (Signed)
Left message to return phone call.

## 2021-03-14 NOTE — Telephone Encounter (Signed)
Patient called back and was given message.

## 2021-03-14 NOTE — Telephone Encounter (Signed)
PA was closed due to already having a denial on file. Will send to PCP for advise.

## 2021-03-15 NOTE — Telephone Encounter (Signed)
Patient notified of update  and verbalized understanding. 

## 2021-03-15 NOTE — Telephone Encounter (Signed)
Verbally spoke to [facility] and he advised that we will wait to substitute Wegovy when pt cometo appt. 03/19/2021

## 2021-03-15 NOTE — Telephone Encounter (Signed)
Please advise 

## 2021-03-19 ENCOUNTER — Ambulatory Visit (INDEPENDENT_AMBULATORY_CARE_PROVIDER_SITE_OTHER): Payer: 59 | Admitting: Adult Health

## 2021-03-19 VITALS — BP 120/60 | HR 83 | Temp 98.6°F | Ht 74.0 in | Wt 253.0 lb

## 2021-03-19 DIAGNOSIS — Z713 Dietary counseling and surveillance: Secondary | ICD-10-CM

## 2021-03-19 DIAGNOSIS — E668 Other obesity: Secondary | ICD-10-CM

## 2021-03-19 DIAGNOSIS — D492 Neoplasm of unspecified behavior of bone, soft tissue, and skin: Secondary | ICD-10-CM | POA: Diagnosis not present

## 2021-03-19 MED ORDER — WEGOVY 2.4 MG/0.75ML ~~LOC~~ SOAJ
2.4000 mg | SUBCUTANEOUS | 0 refills | Status: DC
Start: 2021-03-19 — End: 2022-04-24

## 2021-03-19 NOTE — Progress Notes (Signed)
Subjective:    Patient ID: Brandon Bailey, male    DOB: [birth year], 50 y.o.   MRN: [id number]  HPI  50 year old male who  has a past medical history of Coronary artery disease, non-occlusive, GERD (gastroesophageal reflux disease), High cholesterol, Morbid obesity ([place]), Pre-diabetes, and Sleep apnea.  He presents to the office today for follow up  Weight loss management -history of gastric sleeve in March 2018.  Was on phentermine in the past but was not seeing results.  Was started on Wegovy back in March 2022 and has done well with this medication.  He reports in October he was down to 239 pounds down from 283 at its highest.  Reports that insurance stopped paying for this medication due to "not meeting criteria".  Criteria per his insurance is that of 5% weight loss and maintaining a 5% weight loss, and believe that he has met this criteria.  Stopping the medication his weight is started to go up from 239 pounds to 253 pounds today.  He has not had Wegovy since the end of October.  Wt Readings from Last 10 Encounters:  03/19/21 253 lb (114.8 kg)  12/25/20 249 lb (112.9 kg)  10/09/20 267 lb (121.1 kg)  09/06/20 275 lb (124.7 kg)  08/09/20 278 lb (126.1 kg)  07/31/20 283 lb (128.4 kg)  05/22/20 283 lb (128.4 kg)  04/03/20 275 lb 3.2 oz (124.8 kg)  11/03/18 256 lb (116.1 kg)  05/04/18 243 lb (110.2 kg)   Additionally, he would like referred to dermatology for concern of neoplasm near his right temple.  Believes area in question has been getting larger in size.  Denies itching, pain, or drainage   Review of Systems See HPI   Past Medical History:  Diagnosis Date   Coronary artery disease, non-occlusive    a. LHC 10/10/2016: oLCx 30%, normal EF, nl LVEDP   GERD (gastroesophageal reflux disease)    High cholesterol    Morbid obesity (HCC)    Pre-diabetes    Sleep apnea    h/o, resolved since gastric surgery in 2018    Social History   Socioeconomic History   Marital  status: Married    Spouse name: Not on file   Number of children: 2   Years of education: Not on file   Highest education level: Associate degree: academic program  Occupational History   Occupation: Writer  Tobacco Use   Smoking status: Never   Smokeless tobacco: Never  Vaping Use   Vaping Use: Never used  Substance and Sexual Activity   Alcohol use: No    Alcohol/week: 0.0 standard drinks   Drug use: No   Sexual activity: Not on file  Other Topics Concern   Not on file  Social History Narrative   Works for [employer].    Social Determinants of Health   Financial Resource Strain: Low Risk    Difficulty of Paying Living Expenses: Not hard at all  Food Insecurity: No Food Insecurity   Worried About Charity fundraiser in the Last Year: Never true   [place] in the Last Year: Never true  Transportation Needs: No Transportation Needs   Lack of Transportation (Medical): No   Lack of Transportation (Non-Medical): No  Physical Activity: Unknown   Days of Exercise per Week: Patient refused   Minutes of Exercise per Session: Not on file  Stress: No Stress Concern Present   Feeling of Stress : Not at all  Social Connections: Moderately Integrated   Frequency of Communication with Friends and Family: More than three times a week   Frequency of Social Gatherings with Friends and Family: Three times a week   Attends Religious Services: 1 to 4 times per year   Active Member of Clubs or Organizations: No   Attends Music therapist: Not on file   Marital Status: Married  Human resources officer Violence: Not on file    Past Surgical History:  Procedure Laterality Date   BREATH TEK H PYLORI N/A 08/09/2014   Procedure: BREATH TEK H PYLORI;  Surgeon: Johnathan Hausen, MD;  Location: [facility];  Service: General;  Laterality: N/A;   COLONOSCOPY     LAPAROSCOPIC GASTRIC SLEEVE RESECTION N/A 06/30/2016   Procedure: LAPAROSCOPIC GASTRIC SLEEVE RESECTION  WITH UPPER ENDO;  Surgeon: Johnathan Hausen, MD;  Location: [facility];  Service: General;  Laterality: N/A;   LEFT HEART CATH AND CORONARY ANGIOGRAPHY N/A 10/10/2016   Procedure: Left Heart Cath and Coronary Angiography;  Surgeon: Wellington Hampshire, MD;  Location: [facility];  Service: Cardiovascular;  Laterality: N/A;   UPPER GASTROINTESTINAL ENDOSCOPY     VASECTOMY  2012    Family History  Problem Relation Age of Onset   Cancer Father 49       melanoma   Allergies Father    High blood pressure Father    Heart disease Paternal Grandfather    Heart disease Paternal Grandmother    COPD Maternal Grandmother        non smoker    Stroke Maternal Grandfather    Colon cancer Neg Hx    Esophageal cancer Neg Hx    Rectal cancer Neg Hx    Stomach cancer Neg Hx     No Known Allergies  Current Outpatient Medications on File Prior to Visit  Medication Sig Dispense Refill   aspirin EC 81 MG tablet Take 81 mg by mouth daily.     Cholecalciferol (VITAMIN D) 2000 units CAPS Take 2,000 Units by mouth daily.     Cyanocobalamin (VITAMIN B-12 PO) Take 1 tablet by mouth daily.     Multiple Vitamins-Minerals (ZINC PO) Take 1 capsule by mouth daily.     OVER THE COUNTER MEDICATION Take 2 tablets by mouth daily. OTC Bariatric Multivitamin post-bariatric surgery     Red Yeast Rice 600 MG CAPS Take 2 capsules by mouth daily.     Semaglutide-Weight Management (WEGOVY) 2.4 MG/0.75ML SOAJ Inject 2.4 mg into the skin once a week. Inject 2.4 mg into the skin once a week. 9 mL 0   vitamin C (ASCORBIC ACID) 500 MG tablet Take 500 mg by mouth daily.     No current facility-administered medications on file prior to visit.    BP 120/60   Pulse 83   Temp 98.6 F (37 C) (Oral)   Ht 6' 2" (1.88 m)   Wt 253 lb (114.8 kg)   SpO2 96%   BMI 32.48 kg/m       Objective:   Physical Exam Vitals and nursing note reviewed.  Constitutional:      Appearance: Normal appearance. He is obese.  Cardiovascular:      Rate and Rhythm: Normal rate and regular rhythm.     Pulses: Normal pulses.     Heart sounds: Normal heart sounds.  Pulmonary:     Effort: Pulmonary effort is normal.     Breath sounds: Normal breath sounds.  Skin:    General: Skin is  warm and dry.     Comments: 1 cm flat neoplasm noted near right temple.  Neurological:     General: No focal deficit present.     Mental Status: He is alert and oriented to person, place, and time.  Psychiatric:        Mood and Affect: Mood normal.        Behavior: Behavior normal.        Thought Content: Thought content normal.        Judgment: Judgment normal.      Assessment & Plan:  1. Other obesity - Weight is trending up since stopping medication. Patient has done well with this medication and I would like to keep him on it. Will resubmit - Semaglutide-Weight Management (WEGOVY) 2.4 MG/0.75ML SOAJ; Inject 2.4 mg into the skin once a week. Inject 2.4 mg into the skin once a week.  Dispense: 9 mL; Refill: 0  2. Skin neoplasm  - Ambulatory referral to Dermatology  3. Weight loss counseling, encounter for  - Semaglutide-Weight Management (WEGOVY) 2.4 MG/0.75ML SOAJ; Inject 2.4 mg into the skin once a week. Inject 2.4 mg into the skin once a week.  Dispense: 9 mL; Refill: 0  Dorothyann Peng, NP

## 2021-04-04 ENCOUNTER — Ambulatory Visit (INDEPENDENT_AMBULATORY_CARE_PROVIDER_SITE_OTHER): Payer: 59 | Admitting: Adult Health

## 2021-04-04 ENCOUNTER — Other Ambulatory Visit: Payer: Self-pay

## 2021-04-04 ENCOUNTER — Ambulatory Visit (INDEPENDENT_AMBULATORY_CARE_PROVIDER_SITE_OTHER): Payer: 59

## 2021-04-04 ENCOUNTER — Encounter: Payer: Self-pay | Admitting: Adult Health

## 2021-04-04 VITALS — BP 124/62 | HR 74 | Temp 98.2°F | Ht 74.0 in | Wt 252.0 lb

## 2021-04-04 DIAGNOSIS — E668 Other obesity: Secondary | ICD-10-CM

## 2021-04-04 DIAGNOSIS — Z125 Encounter for screening for malignant neoplasm of prostate: Secondary | ICD-10-CM | POA: Diagnosis not present

## 2021-04-04 DIAGNOSIS — I2510 Atherosclerotic heart disease of native coronary artery without angina pectoris: Secondary | ICD-10-CM

## 2021-04-04 DIAGNOSIS — Z114 Encounter for screening for human immunodeficiency virus [HIV]: Secondary | ICD-10-CM

## 2021-04-04 DIAGNOSIS — M5442 Lumbago with sciatica, left side: Secondary | ICD-10-CM

## 2021-04-04 DIAGNOSIS — G8929 Other chronic pain: Secondary | ICD-10-CM

## 2021-04-04 DIAGNOSIS — Z1159 Encounter for screening for other viral diseases: Secondary | ICD-10-CM

## 2021-04-04 DIAGNOSIS — Z0000 Encounter for general adult medical examination without abnormal findings: Secondary | ICD-10-CM | POA: Diagnosis not present

## 2021-04-04 DIAGNOSIS — Z9884 Bariatric surgery status: Secondary | ICD-10-CM

## 2021-04-04 LAB — CBC WITH DIFFERENTIAL/PLATELET
Basophils Absolute: 0 10*3/uL (ref 0.0–0.1)
Basophils Relative: 0.8 % (ref 0.0–3.0)
Eosinophils Absolute: 0.2 10*3/uL (ref 0.0–0.7)
Eosinophils Relative: 2.9 % (ref 0.0–5.0)
HCT: 44.3 % (ref 39.0–52.0)
Hemoglobin: 14.7 g/dL (ref 13.0–17.0)
Lymphocytes Relative: 26.1 % (ref 12.0–46.0)
Lymphs Abs: 1.4 10*3/uL (ref 0.7–4.0)
MCHC: 33.2 g/dL (ref 30.0–36.0)
MCV: 90.5 fl (ref 78.0–100.0)
Monocytes Absolute: 0.5 10*3/uL (ref 0.1–1.0)
Monocytes Relative: 10 % (ref 3.0–12.0)
Neutro Abs: 3.3 10*3/uL (ref 1.4–7.7)
Neutrophils Relative %: 60.2 % (ref 43.0–77.0)
Platelets: 235 10*3/uL (ref 150.0–400.0)
RBC: 4.9 Mil/uL (ref 4.22–5.81)
RDW: 13.2 % (ref 11.5–15.5)
WBC: 5.4 10*3/uL (ref 4.0–10.5)

## 2021-04-04 LAB — IBC + FERRITIN
Ferritin: 39.8 ng/mL (ref 22.0–322.0)
Iron: 112 ug/dL (ref 42–165)
Saturation Ratios: 24.8 % (ref 20.0–50.0)
TIBC: 450.8 ug/dL — ABNORMAL HIGH (ref 250.0–450.0)
Transferrin: 322 mg/dL (ref 212.0–360.0)

## 2021-04-04 LAB — COMPREHENSIVE METABOLIC PANEL
ALT: 30 U/L (ref 0–53)
AST: 23 U/L (ref 0–37)
Albumin: 4.5 g/dL (ref 3.5–5.2)
Alkaline Phosphatase: 52 U/L (ref 39–117)
BUN: 16 mg/dL (ref 6–23)
CO2: 30 mEq/L (ref 19–32)
Calcium: 9.4 mg/dL (ref 8.4–10.5)
Chloride: 102 mEq/L (ref 96–112)
Creatinine, Ser: 0.98 mg/dL (ref 0.40–1.50)
GFR: 89.15 mL/min (ref >60.00)
Glucose, Bld: 88 mg/dL (ref 70–99)
Potassium: 4.2 mEq/L (ref 3.5–5.1)
Sodium: 140 mEq/L (ref 135–145)
Total Bilirubin: 0.9 mg/dL (ref 0.2–1.2)
Total Protein: 7.2 g/dL (ref 6.0–8.3)

## 2021-04-04 LAB — PSA: PSA: 0.39 ng/mL (ref 0.10–4.00)

## 2021-04-04 LAB — VITAMIN B12: Vitamin B-12: 376 pg/mL (ref 211–911)

## 2021-04-04 LAB — LIPID PANEL
Cholesterol: 238 mg/dL — ABNORMAL HIGH (ref 0–200)
HDL: 50.7 mg/dL (ref >39.00)
LDL Cholesterol: 160 mg/dL — ABNORMAL HIGH (ref 0–99)
NonHDL: 187.24
Total CHOL/HDL Ratio: 5
Triglycerides: 136 mg/dL (ref 0.0–149.0)
VLDL: 27.2 mg/dL (ref 0.0–40.0)

## 2021-04-04 LAB — VITAMIN D 25 HYDROXY (VIT D DEFICIENCY, FRACTURES): VITD: 19.17 ng/mL — ABNORMAL LOW (ref 30.00–100.00)

## 2021-04-04 LAB — TSH: TSH: 1.39 u[IU]/mL (ref 0.35–5.50)

## 2021-04-04 LAB — HEMOGLOBIN A1C: Hgb A1c MFr Bld: 5.4 % (ref 4.6–6.5)

## 2021-04-04 LAB — FOLATE: Folate: 11.3 ng/mL (ref >5.9)

## 2021-04-04 MED ORDER — PREDNISONE 10 MG PO TABS: ORAL_TABLET | ORAL | 0 refills | Status: DC

## 2021-04-04 NOTE — Patient Instructions (Signed)
It was great seeing you today   We will check your labs and get xrays of low back and left hip.   I have sent in prednisone to help with the sciatica pain

## 2021-04-04 NOTE — Progress Notes (Signed)
Subjective:    Patient ID: Brandon Bailey, male    DOB: 1971/01/31, 50 y.o.   MRN: [id number]  HPI Patient presents for yearly preventative medicine examination. He is a pleasant 50 year old male who  has a past medical history of Coronary artery disease, non-occlusive, GERD (gastroesophageal reflux disease), High cholesterol, Morbid obesity ([place]), Pre-diabetes, and Sleep apnea.  Obesity, other -history of gastric sleeve in March 2018.  He was also on phentermine in the past but did not see resolved with this medication.  Back in March 2022 he was started on Wegovy and had done well with this medication.  In October 2022 he was down to 239 pounds this was down from 283 pounds at its highest. He has not been able to get Wegovy filled due to insurance. Needs PA done.    Wt Readings from Last 3 Encounters:  04/04/21 252 lb (114.3 kg)  03/19/21 253 lb (114.8 kg)  12/25/20 249 lb (112.9 kg)   CAD -history of cardiac cath in July 2018.  This showed mild nonobstructive one-vessel coronary artery disease with 33% ostial left circumflex stenosis.  His EF at this time was 50 to 55%. Currently cholesterol panel improved with weight loss.  He does take a daily baby aspirin.  Lab Results  Component Value Date   CHOL 221 (H) 04/03/2020   HDL 55.30 04/03/2020   LDLCALC 141 (H) 04/03/2020   LDLDIRECT 151.2 04/21/2012   TRIG 125.0 04/03/2020   CHOLHDL 4 04/03/2020   Chronic left lower back/left hip pain with sciatica - has been present for quite some time. Feels as though his pain is getting worse. Feels more stiff in his low back and left hip in the morning and when changing positions. Also reports a chronic burning sensation that radiates down the outside of his lower leg.   All immunizations and health maintenance protocols were reviewed with the patient and needed orders were placed.  Appropriate screening laboratory values were ordered for the patient including screening of hyperlipidemia, renal  function and hepatic function. If indicated by BPH, a PSA was ordered.  Medication reconciliation,  past medical history, social history, problem list and allergies were reviewed in detail with the patient  Goals were established with regard to weight loss, exercise, and  diet in compliance with medications  He is up to date on routine colon cancer screening   Review of Systems  Constitutional: Negative.   HENT: Negative.    Eyes: Negative.   Respiratory: Negative.    Cardiovascular: Negative.   Gastrointestinal: Negative.   Endocrine: Negative.   Genitourinary: Negative.   Musculoskeletal:  Positive for arthralgias and back pain.  Skin: Negative.   Allergic/Immunologic: Negative.   Neurological: Negative.   Hematological: Negative.   Psychiatric/Behavioral: Negative.    All other systems reviewed and are negative. Past Medical History:  Diagnosis Date   Coronary artery disease, non-occlusive    a. LHC 10/10/2016: oLCx 30%, normal EF, nl LVEDP   GERD (gastroesophageal reflux disease)    High cholesterol    Morbid obesity ([facility])    Pre-diabetes    Sleep apnea    h/o, resolved since gastric surgery in 2018    Social History   Socioeconomic History   Marital status: Married    Spouse name: Not on file   Number of children: 2   Years of education: Not on file   Highest education level: Associate degree: academic program  Occupational History   Occupation: Writer  Tobacco Use   Smoking status: Never   Smokeless tobacco: Never  Vaping Use   Vaping Use: Never used  Substance and Sexual Activity   Alcohol use: No    Alcohol/week: 0.0 standard drinks   Drug use: No   Sexual activity: Not on file  Other Topics Concern   Not on file  Social History Narrative   Works for [employer].    Social Determinants of Health   Financial Resource Strain: Low Risk    Difficulty of Paying Living Expenses: Not hard at all  Food Insecurity: No Food Insecurity    Worried About Charity fundraiser in the Last Year: Never true   [place] in the Last Year: Never true  Transportation Needs: No Transportation Needs   Lack of Transportation (Medical): No   Lack of Transportation (Non-Medical): No  Physical Activity: Unknown   Days of Exercise per Week: Patient refused   Minutes of Exercise per Session: Not on file  Stress: No Stress Concern Present   Feeling of Stress : Not at all  Social Connections: Moderately Integrated   Frequency of Communication with Friends and Family: More than three times a week   Frequency of Social Gatherings with Friends and Family: Three times a week   Attends Religious Services: 1 to 4 times per year   Active Member of Clubs or Organizations: No   Attends Music therapist: Not on file   Marital Status: Married  Human resources officer Violence: Not on file    Past Surgical History:  Procedure Laterality Date   BREATH TEK H PYLORI N/A 08/09/2014   Procedure: BREATH TEK H PYLORI;  Surgeon: Johnathan Hausen, MD;  Location: [facility];  Service: General;  Laterality: N/A;   COLONOSCOPY     LAPAROSCOPIC GASTRIC SLEEVE RESECTION N/A 06/30/2016   Procedure: LAPAROSCOPIC GASTRIC SLEEVE RESECTION WITH UPPER ENDO;  Surgeon: Johnathan Hausen, MD;  Location: [facility];  Service: General;  Laterality: N/A;   LEFT HEART CATH AND CORONARY ANGIOGRAPHY N/A 10/10/2016   Procedure: Left Heart Cath and Coronary Angiography;  Surgeon: Wellington Hampshire, MD;  Location: [facility];  Service: Cardiovascular;  Laterality: N/A;   UPPER GASTROINTESTINAL ENDOSCOPY     VASECTOMY  2012    Family History  Problem Relation Age of Onset   Cancer Father 37       melanoma   Allergies Father    High blood pressure Father    Heart disease Paternal Grandfather    Heart disease Paternal Grandmother    COPD Maternal Grandmother        non smoker    Stroke Maternal Grandfather    Colon cancer Neg Hx    Esophageal cancer Neg Hx     Rectal cancer Neg Hx    Stomach cancer Neg Hx     No Known Allergies  Current Outpatient Medications on File Prior to Visit  Medication Sig Dispense Refill   aspirin EC 81 MG tablet Take 81 mg by mouth daily.     Cholecalciferol (VITAMIN D) 2000 units CAPS Take 2,000 Units by mouth daily.     Cyanocobalamin (VITAMIN B-12 PO) Take 1 tablet by mouth daily.     Multiple Vitamins-Minerals (ZINC PO) Take 1 capsule by mouth daily.     OVER THE COUNTER MEDICATION Take 2 tablets by mouth daily. OTC Bariatric Multivitamin post-bariatric surgery     Red Yeast Rice 600 MG CAPS Take 2 capsules by mouth daily.  Semaglutide-Weight Management (WEGOVY) 2.4 MG/0.75ML SOAJ Inject 2.4 mg into the skin once a week. Inject 2.4 mg into the skin once a week. 9 mL 0   vitamin C (ASCORBIC ACID) 500 MG tablet Take 500 mg by mouth daily.     No current facility-administered medications on file prior to visit.    There were no vitals taken for this visit.      Objective:   Physical Exam Vitals and nursing note reviewed.  Constitutional:      General: He is not in acute distress.    Appearance: Normal appearance. He is well-developed. He is obese.  HENT:     Head: Normocephalic and atraumatic.     Right Ear: Tympanic membrane, ear canal and external ear normal. There is no impacted cerumen.     Left Ear: Tympanic membrane, ear canal and external ear normal. There is no impacted cerumen.     Nose: Nose normal. No congestion or rhinorrhea.     Mouth/Throat:     Mouth: Mucous membranes are moist.     Pharynx: Oropharynx is clear. No oropharyngeal exudate or posterior oropharyngeal erythema.  Eyes:     General:        Right eye: No discharge.        Left eye: No discharge.     Extraocular Movements: Extraocular movements intact.     Conjunctiva/sclera: Conjunctivae normal.     Pupils: Pupils are equal, round, and reactive to light.  Neck:     Vascular: No carotid bruit.     Trachea: No tracheal  deviation.  Cardiovascular:     Rate and Rhythm: Normal rate and regular rhythm.     Pulses: Normal pulses.     Heart sounds: Normal heart sounds. No murmur heard.   No friction rub. No gallop.  Pulmonary:     Effort: Pulmonary effort is normal. No respiratory distress.     Breath sounds: Normal breath sounds. No stridor. No wheezing, rhonchi or rales.  Chest:     Chest wall: No tenderness.  Abdominal:     General: Bowel sounds are normal. There is no distension.     Palpations: Abdomen is soft. There is no mass.     Tenderness: There is no abdominal tenderness. There is no right CVA tenderness, left CVA tenderness, guarding or rebound.     Hernia: No hernia is present.  Musculoskeletal:        General: No swelling, tenderness, deformity or signs of injury. Normal range of motion.     Right lower leg: No edema.     Left lower leg: No edema.  Lymphadenopathy:     Cervical: No cervical adenopathy.  Skin:    General: Skin is warm and dry.     Capillary Refill: Capillary refill takes less than 2 seconds.     Coloration: Skin is not jaundiced or pale.     Findings: No bruising, erythema, lesion or rash.  Neurological:     General: No focal deficit present.     Mental Status: He is alert and oriented to person, place, and time.     Cranial Nerves: No cranial nerve deficit.     Sensory: No sensory deficit.     Motor: No weakness.     Coordination: Coordination normal.     Gait: Gait normal.     Deep Tendon Reflexes: Reflexes normal.  Psychiatric:        Mood and Affect: Mood normal.  Behavior: Behavior normal.        Thought Content: Thought content normal.        Judgment: Judgment normal.      Assessment & Plan:  1. Routine general medical examination at a health care facility  - CBC with Differential/Platelet; Future - Comprehensive metabolic panel; Future - Hemoglobin A1c; Future - Lipid panel; Future - TSH; Future  2. Coronary artery disease, non-occlusive -  Consider statin  - CBC with Differential/Platelet; Future - Comprehensive metabolic panel; Future - Hemoglobin A1c; Future - Lipid panel; Future - TSH; Future  3. Prostate cancer screening  - PSA; Future  4. S/P laparoscopic sleeve gastrectomy March 2018  - CBC with Differential/Platelet; Future - Comprehensive metabolic panel; Future - Hemoglobin A1c; Future - Lipid panel; Future - TSH; Future  5. Other obesity - Called [facility] and[facility]oke to pharmacist. Mancel Parsons needs PA. Will get PA completed as his weight is trending up since being off Wegovy.  - CBC with Differential/Platelet; Future - Comprehensive metabolic panel; Future - Hemoglobin A1c; Future - Lipid panel; Future - TSH; Future - Vitamin B12; Future - VITAMIN D 25 Hydroxy (Vit-D Deficiency, Fractures); Future - PTH, Intact and Calcium; Future - Folate; Future - Vitamin A; Future - IBC + Ferritin; Future  6. Need for hepatitis C screening test  - Hep C Antibody; Future  7. Encounter for screening for HIV  - HIV Antibody (routine testing w rflx); Future  8. Chronic left-sided low back pain with left-sided sciatica - Will send in prednisone taper. Consider referral to orthopedics or advanced imaging  - DG Lumbar Spine Complete; Future - DG Hip Unilat W OR W/O Pelvis 2-3 Views Left; Future  Dorothyann Peng, NP

## 2021-04-09 ENCOUNTER — Other Ambulatory Visit: Payer: Self-pay

## 2021-04-09 MED ORDER — SIMVASTATIN 10 MG PO TABS: 10.0000 mg | ORAL_TABLET | Freq: Every day | ORAL | 3 refills | Status: DC

## 2021-04-10 LAB — HIV ANTIBODY (ROUTINE TESTING W REFLEX): HIV 1&2 Ab, 4th Generation: NONREACTIVE

## 2021-04-10 LAB — HEPATITIS C ANTIBODY
Hepatitis C Ab: NONREACTIVE
SIGNAL TO CUT-OFF: 0.03 (ref <1.00)

## 2021-04-10 LAB — PTH, INTACT AND CALCIUM
Calcium: 9.4 mg/dL (ref 8.6–10.3)
PTH: 62 pg/mL (ref 16–77)

## 2021-04-10 LAB — VITAMIN A: Vitamin A (Retinoic Acid): 50 ug/dL (ref 38–98)

## 2022-01-15 ENCOUNTER — Encounter (HOSPITAL_COMMUNITY): Payer: Self-pay | Admitting: *Deleted

## 2022-04-24 ENCOUNTER — Encounter: Payer: Self-pay | Admitting: Adult Health

## 2022-04-24 ENCOUNTER — Ambulatory Visit (INDEPENDENT_AMBULATORY_CARE_PROVIDER_SITE_OTHER): Payer: 59 | Admitting: Adult Health

## 2022-04-24 VITALS — BP 100/82 | HR 82 | Temp 98.3°F | Ht 72.75 in | Wt 275.0 lb

## 2022-04-24 DIAGNOSIS — E668 Other obesity: Secondary | ICD-10-CM

## 2022-04-24 DIAGNOSIS — I2510 Atherosclerotic heart disease of native coronary artery without angina pectoris: Secondary | ICD-10-CM | POA: Diagnosis not present

## 2022-04-24 DIAGNOSIS — Z125 Encounter for screening for malignant neoplasm of prostate: Secondary | ICD-10-CM | POA: Diagnosis not present

## 2022-04-24 DIAGNOSIS — Z0000 Encounter for general adult medical examination without abnormal findings: Secondary | ICD-10-CM | POA: Diagnosis not present

## 2022-04-24 DIAGNOSIS — Z9884 Bariatric surgery status: Secondary | ICD-10-CM

## 2022-04-24 LAB — CBC WITH DIFFERENTIAL/PLATELET
Basophils Absolute: 0.1 10*3/uL (ref 0.0–0.1)
Basophils Relative: 1.3 % (ref 0.0–3.0)
Eosinophils Absolute: 0.4 10*3/uL (ref 0.0–0.7)
Eosinophils Relative: 6.3 % — ABNORMAL HIGH (ref 0.0–5.0)
HCT: 45.9 % (ref 39.0–52.0)
Hemoglobin: 15.8 g/dL (ref 13.0–17.0)
Lymphocytes Relative: 26.1 % (ref 12.0–46.0)
Lymphs Abs: 1.5 10*3/uL (ref 0.7–4.0)
MCHC: 34.4 g/dL (ref 30.0–36.0)
MCV: 89.2 fl (ref 78.0–100.0)
Monocytes Absolute: 0.7 10*3/uL (ref 0.1–1.0)
Monocytes Relative: 12.9 % — ABNORMAL HIGH (ref 3.0–12.0)
Neutro Abs: 3 10*3/uL (ref 1.4–7.7)
Neutrophils Relative %: 53.4 % (ref 43.0–77.0)
Platelets: 233 10*3/uL (ref 150.0–400.0)
RBC: 5.14 Mil/uL (ref 4.22–5.81)
RDW: 13.3 % (ref 11.5–15.5)
WBC: 5.7 10*3/uL (ref 4.0–10.5)

## 2022-04-24 LAB — LIPID PANEL
Cholesterol: 183 mg/dL (ref 0–200)
HDL: 46.4 mg/dL (ref >39.00)
LDL Cholesterol: 115 mg/dL — ABNORMAL HIGH (ref 0–99)
NonHDL: 136.99
Total CHOL/HDL Ratio: 4
Triglycerides: 111 mg/dL (ref 0.0–149.0)
VLDL: 22.2 mg/dL (ref 0.0–40.0)

## 2022-04-24 LAB — COMPREHENSIVE METABOLIC PANEL
ALT: 16 U/L (ref 0–53)
AST: 17 U/L (ref 0–37)
Albumin: 4.5 g/dL (ref 3.5–5.2)
Alkaline Phosphatase: 63 U/L (ref 39–117)
BUN: 15 mg/dL (ref 6–23)
CO2: 29 mEq/L (ref 19–32)
Calcium: 9.4 mg/dL (ref 8.4–10.5)
Chloride: 103 mEq/L (ref 96–112)
Creatinine, Ser: 0.97 mg/dL (ref 0.40–1.50)
GFR: 89.59 mL/min (ref >60.00)
Glucose, Bld: 91 mg/dL (ref 70–99)
Potassium: 4.5 mEq/L (ref 3.5–5.1)
Sodium: 139 mEq/L (ref 135–145)
Total Bilirubin: 0.7 mg/dL (ref 0.2–1.2)
Total Protein: 7.2 g/dL (ref 6.0–8.3)

## 2022-04-24 LAB — TSH: TSH: 1.41 u[IU]/mL (ref 0.35–5.50)

## 2022-04-24 LAB — PSA: PSA: 0.41 ng/mL (ref 0.10–4.00)

## 2022-04-24 MED ORDER — SEMAGLUTIDE-WEIGHT MANAGEMENT 0.25 MG/0.5ML ~~LOC~~ SOAJ: 0.2500 mg | SUBCUTANEOUS | 0 refills | Status: DC

## 2022-04-24 NOTE — Patient Instructions (Signed)
It was great seeing you today   We will follow up with you regarding your lab work   Please let me know if you need anything   

## 2022-04-24 NOTE — Progress Notes (Signed)
Subjective:    Patient ID: Brandon Bailey, male    DOB: Aug 17, 1970, 52 y.o.   MRN: [id number]  HPI Patient presents for yearly preventative medicine examination. He is a pleasant 52 year old male who  has a past medical history of Coronary artery disease, non-occlusive, GERD (gastroesophageal reflux disease), High cholesterol, Morbid obesity ([place]), Pre-diabetes, and Sleep apnea.  Obesity, other -history of gastric sleeve in March 2018.  He was also on phentermine in the past but did not see resolved with this medication.  Back in March 2022 he was started on Wegovy and had done well with this medication.  In October 2022 he was down to 239 pounds this was down from 283 pounds at its highest. He has not been able to get Wegovy filled to denial from his insurance . He does have new health insurance and would like to get back on Wegovy. He is trying to eat healthy but is not exercising  Wt Readings from Last 3 Encounters:  04/24/22 275 lb (124.7 kg)  04/04/21 252 lb (114.3 kg)  03/19/21 253 lb (114.8 kg)    CAD -history of cardiac cath in July 2018.  This showed mild nonobstructive one-vessel coronary artery disease with 33% ostial left circumflex stenosis.  His EF at this time was 50 to 55%. Currently cholesterol panel improved with weight loss. He stopped simvastatin due to body aches and started taking red yeast rice.  Lab Results  Component Value Date   CHOL 238 (H) 04/04/2021   HDL 50.70 04/04/2021   LDLCALC 160 (H) 04/04/2021   LDLDIRECT 151.2 04/21/2012   TRIG 136.0 04/04/2021   CHOLHDL 5 04/04/2021    All immunizations and health maintenance protocols were reviewed with the patient and needed orders were placed. Refused flu and shingles   Appropriate screening laboratory values were ordered for the patient including screening of hyperlipidemia, renal function and hepatic function. If indicated by BPH, a PSA was ordered.  Medication reconciliation,  past medical history, social  history, problem list and allergies were reviewed in detail with the patient  Goals were established with regard to weight loss, exercise, and  diet in compliance with medications  He is up to date on routine colon cancer screening   Review of Systems  Constitutional: Negative.   HENT: Negative.    Eyes: Negative.   Respiratory: Negative.    Cardiovascular: Negative.   Gastrointestinal: Negative.   Endocrine: Negative.   Genitourinary: Negative.   Musculoskeletal: Negative.   Skin: Negative.   Allergic/Immunologic: Negative.   Neurological: Negative.   Hematological: Negative.   Psychiatric/Behavioral: Negative.    All other systems reviewed and are negative.  Past Medical History:  Diagnosis Date   Coronary artery disease, non-occlusive    a. LHC 10/10/2016: oLCx 30%, normal EF, nl LVEDP   GERD (gastroesophageal reflux disease)    High cholesterol    Morbid obesity ([facility])    Pre-diabetes    Sleep apnea    h/o, resolved since gastric surgery in 2018    Social History   Socioeconomic History   Marital status: Married    Spouse name: Not on file   Number of children: 2   Years of education: Not on file   Highest education level: Associate degree: academic program  Occupational History   Occupation: Writer  Tobacco Use   Smoking status: Never   Smokeless tobacco: Never  Vaping Use   Vaping Use: Never used  Substance and Sexual Activity  Alcohol use: No    Alcohol/week: 0.0 standard drinks of alcohol   Drug use: No   Sexual activity: Not on file  Other Topics Concern   Not on file  Social History Narrative   Works for [employer].    Social Determinants of Health   Financial Resource Strain: Low Risk  (03/19/2021)   Overall Financial Resource Strain (CARDIA)    Difficulty of Paying Living Expenses: Not hard at all  Food Insecurity: No Food Insecurity (03/19/2021)   Hunger Vital Sign    Worried About Running Out of Food in the Last Year:  Never true    Ran Out of Food in the Last Year: Never true  Transportation Needs: No Transportation Needs (03/19/2021)   PRAPARE - Hydrologist (Medical): No    Lack of Transportation (Non-Medical): No  Physical Activity: Unknown (03/19/2021)   Exercise Vital Sign    Days of Exercise per Week: Patient refused    Minutes of Exercise per Session: Not on file  Stress: No Stress Concern Present (03/19/2021)   [place]    Feeling of Stress : Not at all  Social Connections: Moderately Integrated (03/19/2021)   Social Connection and Isolation Panel [NHANES]    Frequency of Communication with Friends and Family: More than three times a week    Frequency of Social Gatherings with Friends and Family: Three times a week    Attends Religious Services: 1 to 4 times per year    Active Member of Clubs or Organizations: No    Attends Music therapist: Not on file    Marital Status: Married  Human resources officer Violence: Not on file    Past Surgical History:  Procedure Laterality Date   BREATH TEK H PYLORI N/A 08/09/2014   Procedure: BREATH TEK H PYLORI;  Surgeon: Johnathan Hausen, MD;  Location: [facility];  Service: General;  Laterality: N/A;   COLONOSCOPY     LAPAROSCOPIC GASTRIC SLEEVE RESECTION N/A 06/30/2016   Procedure: LAPAROSCOPIC GASTRIC SLEEVE RESECTION WITH UPPER ENDO;  Surgeon: Johnathan Hausen, MD;  Location: [facility];  Service: General;  Laterality: N/A;   LEFT HEART CATH AND CORONARY ANGIOGRAPHY N/A 10/10/2016   Procedure: Left Heart Cath and Coronary Angiography;  Surgeon: Wellington Hampshire, MD;  Location: [facility];  Service: Cardiovascular;  Laterality: N/A;   UPPER GASTROINTESTINAL ENDOSCOPY     VASECTOMY  2012    Family History  Problem Relation Age of Onset   Cancer Father 26       melanoma   Allergies Father    High blood pressure Father    Heart disease  Paternal Grandfather    Heart disease Paternal Grandmother    COPD Maternal Grandmother        non smoker    Stroke Maternal Grandfather    Colon cancer Neg Hx    Esophageal cancer Neg Hx    Rectal cancer Neg Hx    Stomach cancer Neg Hx     Allergies  Allergen Reactions   Simvastatin Other (See Comments)    Body aches     Current Outpatient Medications on File Prior to Visit  Medication Sig Dispense Refill   aspirin EC 81 MG tablet Take 81 mg by mouth daily.     Cholecalciferol (VITAMIN D) 2000 units CAPS Take 2,000 Units by mouth daily.     Cyanocobalamin (VITAMIN B-12 PO) Take 1 tablet by mouth  daily.     Multiple Vitamins-Minerals (ZINC PO) Take 1 capsule by mouth daily.     OVER THE COUNTER MEDICATION Take 2 tablets by mouth daily. OTC Bariatric Multivitamin post-bariatric surgery     Red Yeast Rice 600 MG CAPS Take 2 capsules by mouth daily.     vitamin C (ASCORBIC ACID) 500 MG tablet Take 500 mg by mouth daily.     No current facility-administered medications on file prior to visit.    BP 100/82   Pulse 82   Temp 98.3 F (36.8 C) (Oral)   Ht 6' 0.75" (1.848 m)   Wt 275 lb (124.7 kg)   SpO2 98%   BMI 36.53 kg/m       Objective:   Physical Exam Vitals and nursing note reviewed.  Constitutional:      General: He is not in acute distress.    Appearance: He is well-developed. He is obese. He is not diaphoretic.  HENT:     Head: Normocephalic and atraumatic.     Right Ear: Tympanic membrane, ear canal and external ear normal.     Left Ear: Tympanic membrane, ear canal and external ear normal.     Nose: Nose normal.     Mouth/Throat:     Mouth: Mucous membranes are moist.     Pharynx: Oropharynx is clear. No oropharyngeal exudate.  Eyes:     General:        Right eye: No discharge.        Left eye: No discharge.     Conjunctiva/sclera: Conjunctivae normal.     Pupils: Pupils are equal, round, and reactive to light.  Neck:     Thyroid: No thyromegaly.      Trachea: No tracheal deviation.  Cardiovascular:     Rate and Rhythm: Normal rate and regular rhythm.     Pulses: Normal pulses.     Heart sounds: Normal heart sounds. No murmur heard.    No friction rub. No gallop.  Pulmonary:     Effort: Pulmonary effort is normal. No respiratory distress.     Breath sounds: Normal breath sounds. No stridor. No wheezing or rales.  Chest:     Chest wall: No tenderness.  Abdominal:     General: Abdomen is flat. Bowel sounds are normal. There is no distension.     Palpations: Abdomen is soft. There is no mass.     Tenderness: There is no abdominal tenderness. There is no right CVA tenderness, left CVA tenderness, guarding or rebound.     Hernia: No hernia is present.  Musculoskeletal:        General: Normal range of motion.  Lymphadenopathy:     Cervical: No cervical adenopathy.  Skin:    General: Skin is warm and dry.     Coloration: Skin is not jaundiced or pale.     Findings: No bruising, erythema, lesion or rash.  Neurological:     General: No focal deficit present.     Mental Status: He is alert and oriented to person, place, and time.     Cranial Nerves: No cranial nerve deficit.     Sensory: No sensory deficit.     Motor: No weakness.     Coordination: Coordination normal.     Gait: Gait normal.     Deep Tendon Reflexes: Reflexes normal.  Psychiatric:        Mood and Affect: Mood normal.        Behavior: Behavior normal.  Thought Content: Thought content normal.        Judgment: Judgment normal.        Assessment & Plan:  1. Routine general medical examination at a health care facility Today patient counseled on age appropriate routine health concerns for screening and prevention, each reviewed and up to date or declined. Immunizations reviewed and up to date or declined. Labs ordered and reviewed. Risk factors for depression reviewed and negative. Hearing function and visual acuity are intact. ADLs screened and addressed as  needed. Functional ability and level of safety reviewed and appropriate. Education, counseling and referrals performed based on assessed risks today. Patient provided with a copy of personalized plan for preventive services.   2. Coronary artery disease, non-occlusive - Consider different statin  - CBC with Differential/Platelet; Future - Comprehensive metabolic panel; Future - Lipid panel; Future - TSH; Future  3. Prostate cancer screening  - PSA; Future  4. S/P laparoscopic sleeve gastrectomy March 2018  - CBC with Differential/Platelet; Future - Comprehensive metabolic panel; Future - Lipid panel; Future - TSH; Future - Semaglutide-Weight Management 0.25 MG/0.5ML SOAJ; Inject 0.25 mg into the skin once a week.  Dispense: 2 mL; Refill: 0  5. Other obesity - Encouraged exercise  - CBC with Differential/Platelet; Future - Comprehensive metabolic panel; Future - Lipid panel; Future - TSH; Future - Semaglutide-Weight Management 0.25 MG/0.5ML SOAJ; Inject 0.25 mg into the skin once a week.  Dispense: 2 mL; Refill: 0 - Will have him send me mychart messages at monthly intervals once he starts [facility]    Dorothyann Peng, NP

## 2022-04-25 ENCOUNTER — Other Ambulatory Visit: Payer: Self-pay

## 2022-04-25 MED ORDER — ROSUVASTATIN CALCIUM 5 MG PO TABS: 5.0000 mg | ORAL_TABLET | Freq: Every day | ORAL | 3 refills | Status: DC

## 2022-07-08 ENCOUNTER — Other Ambulatory Visit: Payer: Self-pay | Admitting: Adult Health

## 2022-07-08 DIAGNOSIS — Z9884 Bariatric surgery status: Secondary | ICD-10-CM

## 2022-07-08 DIAGNOSIS — E668 Other obesity: Secondary | ICD-10-CM

## 2022-07-09 NOTE — Telephone Encounter (Signed)
Pt needs monthly f/u via Mychart message, virtually or in person.

## 2022-07-17 NOTE — Telephone Encounter (Signed)
Left message to return phone call.

## 2022-09-11 ENCOUNTER — Ambulatory Visit: Payer: 59 | Admitting: Adult Health

## 2022-09-16 ENCOUNTER — Ambulatory Visit: Payer: 59 | Admitting: Adult Health

## 2022-09-16 ENCOUNTER — Encounter: Payer: Self-pay | Admitting: Adult Health

## 2022-09-16 VITALS — BP 120/80 | HR 95 | Temp 98.3°F | Ht 72.75 in | Wt 282.0 lb

## 2022-09-16 DIAGNOSIS — Z713 Dietary counseling and surveillance: Secondary | ICD-10-CM | POA: Diagnosis not present

## 2022-09-16 MED ORDER — WEGOVY 0.5 MG/0.5ML ~~LOC~~ SOAJ: 0.5000 mg | SUBCUTANEOUS | 0 refills | Status: DC

## 2022-09-16 NOTE — Progress Notes (Signed)
Subjective:    Patient ID: Brandon Bailey, male    DOB: [birth year], 52 y.o.   MRN: [id number]  HPI 52 year old male who  has a past medical history of Coronary artery disease, non-occlusive, GERD (gastroesophageal reflux disease), High cholesterol, Morbid obesity (HCC), Pre-diabetes, and Sleep apnea.  He presents to the office today for follow up regarding obesity and weight loss management.   He has a history of gastric sleeve in March 2018.  He was also on phentermine in the past but did not see resolved with this medication.  Back in March 2022 he was started on Wegovy and had done well with this medication.  In October 2022 he was down to 239 pounds this was down from 283 pounds at its highest. At this time his insurance denied his prescription. We sent in a prescription in January 2024 but he was not able to pick it up until a month ago when he picked up the 0.25 mg weekly. He has been tolerating this well. No side effects.   Wt Readings from Last 3 Encounters:  09/16/22 282 lb (127.9 kg)  04/24/22 275 lb (124.7 kg)  04/04/21 252 lb (114.3 kg)   Review of Systems See HPI   Past Medical History:  Diagnosis Date   Coronary artery disease, non-occlusive    a. LHC 10/10/2016: oLCx 30%, normal EF, nl LVEDP   GERD (gastroesophageal reflux disease)    High cholesterol    Morbid obesity (HCC)    Pre-diabetes    Sleep apnea    h/o, resolved since gastric surgery in 2018    Social History   Socioeconomic History   Marital status: Married    Spouse name: Not on file   Number of children: 2   Years of education: Not on file   Highest education level: Associate degree: occupational, Scientist, product/process development, or vocational program  Occupational History   Occupation: Curator  Tobacco Use   Smoking status: Never   Smokeless tobacco: Never  Vaping Use   Vaping Use: Never used  Substance and Sexual Activity   Alcohol use: No    Alcohol/week: 0.0 standard drinks of alcohol   Drug  use: No   Sexual activity: Not on file  Other Topics Concern   Not on file  Social History Narrative   Works for [employer].    Social Determinants of Health   Financial Resource Strain: Low Risk  (09/12/2022)   Overall Financial Resource Strain (CARDIA)    Difficulty of Paying Living Expenses: Not hard at all  Food Insecurity: No Food Insecurity (09/12/2022)   Hunger Vital Sign    Worried About Running Out of Food in the Last Year: Never true    Ran Out of Food in the Last Year: Never true  Transportation Needs: No Transportation Needs (09/12/2022)   PRAPARE - Administrator, Civil Service (Medical): No    Lack of Transportation (Non-Medical): No  Physical Activity: Unknown (09/12/2022)   Exercise Vital Sign    Days of Exercise per Week: Patient declined    Minutes of Exercise per Session: Not on file  Stress: No Stress Concern Present (09/12/2022)   [employer] of Occupational Health - Occupational Stress Questionnaire    Feeling of Stress : Only a little  Social Connections: Socially Integrated (09/12/2022)   Social Connection and Isolation Panel [NHANES]    Frequency of Communication with Friends and Family: More than three times a week    Frequency  of Social Gatherings with Friends and Family: Once a week    Attends Religious Services: More than 4 times per year    Active Member of Clubs or Organizations: Yes    Attends Banker Meetings: 1 to 4 times per year    Marital Status: Married  Catering manager Violence: Not on file    Past Surgical History:  Procedure Laterality Date   BREATH TEK H PYLORI N/A 08/09/2014   Procedure: BREATH TEK H PYLORI;  Surgeon: Luretha Murphy, MD;  Location: [facility];  Service: General;  Laterality: N/A;   COLONOSCOPY     LAPAROSCOPIC GASTRIC SLEEVE RESECTION N/A 06/30/2016   Procedure: LAPAROSCOPIC GASTRIC SLEEVE RESECTION WITH UPPER ENDO;  Surgeon: Luretha Murphy, MD;  Location: [facility];  Service: General;   Laterality: N/A;   LEFT HEART CATH AND CORONARY ANGIOGRAPHY N/A 10/10/2016   Procedure: Left Heart Cath and Coronary Angiography;  Surgeon: Iran Ouch, MD;  Location: [facility];  Service: Cardiovascular;  Laterality: N/A;   UPPER GASTROINTESTINAL ENDOSCOPY     VASECTOMY  2012    Family History  Problem Relation Age of Onset   Cancer Father 81       melanoma   Allergies Father    High blood pressure Father    Heart disease Paternal Grandfather    Heart disease Paternal Grandmother    COPD Maternal Grandmother        non smoker    Stroke Maternal Grandfather    Colon cancer Neg Hx    Esophageal cancer Neg Hx    Rectal cancer Neg Hx    Stomach cancer Neg Hx     Allergies  Allergen Reactions   Simvastatin Other (See Comments)    Body aches     Current Outpatient Medications on File Prior to Visit  Medication Sig Dispense Refill   aspirin EC 81 MG tablet Take 81 mg by mouth daily.     Cholecalciferol (VITAMIN D) 2000 units CAPS Take 2,000 Units by mouth daily.     Cyanocobalamin (VITAMIN B-12 PO) Take 1 tablet by mouth daily.     Multiple Vitamins-Minerals (ZINC PO) Take 1 capsule by mouth daily.     OVER THE COUNTER MEDICATION Take 2 tablets by mouth daily. OTC Bariatric Multivitamin post-bariatric surgery     Red Yeast Rice 600 MG CAPS Take 2 capsules by mouth daily.     rosuvastatin (CRESTOR) 5 MG tablet Take 1 tablet (5 mg total) by mouth daily. 90 tablet 3   Semaglutide-Weight Management 0.25 MG/0.5ML SOAJ Inject 0.25 mg into the skin once a week. 2 mL 0   vitamin C (ASCORBIC ACID) 500 MG tablet Take 500 mg by mouth daily.     No current facility-administered medications on file prior to visit.    BP 120/80   Pulse 95   Temp 98.3 F (36.8 C) (Oral)   Ht 6' 0.75" (1.848 m)   Wt 282 lb (127.9 kg)   SpO2 97%   BMI 37.46 kg/m       Objective:   Physical Exam Vitals and nursing note reviewed.  Constitutional:      Appearance: Normal appearance.  He is obese.  Cardiovascular:     Rate and Rhythm: Normal rate and regular rhythm.     Pulses: Normal pulses.     Heart sounds: Normal heart sounds.  Pulmonary:     Effort: Pulmonary effort is normal.     Breath sounds: Normal breath sounds.  Skin:    General: Skin is warm and dry.  Neurological:     General: No focal deficit present.     Mental Status: He is alert and oriented to person, place, and time.  Psychiatric:        Mood and Affect: Mood normal.        Behavior: Behavior normal.        Thought Content: Thought content normal.        Judgment: Judgment normal.        Assessment & Plan:  1. Weight loss counseling, encounter for - Will increase his dose of Wegovy to 0.5 mg weekly.  - Semaglutide-Weight Management (WEGOVY) 0.5 MG/0.5ML SOAJ; Inject 0.5 mg into the skin once a week.  Dispense: 2 mL; Refill: 0 - Follow up virtually in 1 month for further titration   Shirline Frees, NP

## 2022-10-14 ENCOUNTER — Telehealth: Payer: 59 | Admitting: Adult Health

## 2022-10-21 ENCOUNTER — Encounter: Payer: Self-pay | Admitting: Adult Health

## 2022-10-21 ENCOUNTER — Telehealth: Payer: 59 | Admitting: Adult Health

## 2022-10-21 VITALS — Wt 277.0 lb

## 2022-10-21 DIAGNOSIS — Z713 Dietary counseling and surveillance: Secondary | ICD-10-CM

## 2022-10-21 DIAGNOSIS — E668 Other obesity: Secondary | ICD-10-CM | POA: Diagnosis not present

## 2022-10-21 MED ORDER — WEGOVY 1 MG/0.5ML ~~LOC~~ SOAJ: 1.0000 mg | SUBCUTANEOUS | 0 refills | Status: DC

## 2022-10-21 NOTE — Progress Notes (Signed)
Virtual Visit via Video Note  I connected with Brandon Bailey  on 10/21/22 at  5:00 PM EDT by a video enabled telemedicine application and verified that I am speaking with the correct person using two identifiers.  Location patient: home Location provider:work or home office Persons participating in the virtual visit: patient, provider  I discussed the limitations of evaluation and management by telemedicine and the availability of in person appointments. The patient expressed understanding and agreed to proceed.   HPI: He is being evaluated today for follow-up regarding obesity and weight loss management.  He has a history of gastric sleeve in March 2018.  He was also on phentermine in the past but did not see weight loss with this medication.   Back in March 2022 he was started on Wegovy and had done well with this medication.  In October 2022 he was down to 239 pounds this was down from 283 pounds at its highest. At this time his insurance denied his prescription. We have been able to get him back on Wegovy and he is currently at 0.5 mg weekly. He is tolerating this well. He is trying to stay more active and eat healthy.   Wt Readings from Last 3 Encounters:  10/21/22 277 lb (125.6 kg)  09/16/22 282 lb (127.9 kg)  04/24/22 275 lb (124.7 kg)   ROS: See pertinent positives and negatives per HPI.  Past Medical History:  Diagnosis Date   Coronary artery disease, non-occlusive    a. LHC 10/10/2016: oLCx 30%, normal EF, nl LVEDP   GERD (gastroesophageal reflux disease)    High cholesterol    Morbid obesity (HCC)    Pre-diabetes    Sleep apnea    h/o, resolved since gastric surgery in 2018    Past Surgical History:  Procedure Laterality Date   BREATH TEK H PYLORI N/A 08/09/2014   Procedure: BREATH TEK H PYLORI;  Surgeon: Luretha Murphy, MD;  Location: [facility];  Service: General;  Laterality: N/A;   COLONOSCOPY     LAPAROSCOPIC GASTRIC SLEEVE RESECTION N/A 06/30/2016   Procedure:  LAPAROSCOPIC GASTRIC SLEEVE RESECTION WITH UPPER ENDO;  Surgeon: Luretha Murphy, MD;  Location: [facility];  Service: General;  Laterality: N/A;   LEFT HEART CATH AND CORONARY ANGIOGRAPHY N/A 10/10/2016   Procedure: Left Heart Cath and Coronary Angiography;  Surgeon: Iran Ouch, MD;  Location: [facility];  Service: Cardiovascular;  Laterality: N/A;   UPPER GASTROINTESTINAL ENDOSCOPY     VASECTOMY  2012    Family History  Problem Relation Age of Onset   Cancer Father 101       melanoma   Allergies Father    High blood pressure Father    Heart disease Paternal Grandfather    Heart disease Paternal Grandmother    COPD Maternal Grandmother        non smoker    Stroke Maternal Grandfather    Colon cancer Neg Hx    Esophageal cancer Neg Hx    Rectal cancer Neg Hx    Stomach cancer Neg Hx        Current Outpatient Medications:    Semaglutide-Weight Management (WEGOVY) 1 MG/0.5ML SOAJ, Inject 1 mg into the skin once a week., Disp: 2 mL, Rfl: 0   aspirin EC 81 MG tablet, Take 81 mg by mouth daily., Disp: , Rfl:    Cholecalciferol (VITAMIN D) 2000 units CAPS, Take 2,000 Units by mouth daily., Disp: , Rfl:    Cyanocobalamin (VITAMIN B-12 PO),  Take 1 tablet by mouth daily., Disp: , Rfl:    Multiple Vitamins-Minerals (ZINC PO), Take 1 capsule by mouth daily., Disp: , Rfl:    OVER THE COUNTER MEDICATION, Take 2 tablets by mouth daily. OTC Bariatric Multivitamin post-bariatric surgery, Disp: , Rfl:    Red Yeast Rice 600 MG CAPS, Take 2 capsules by mouth daily., Disp: , Rfl:    rosuvastatin (CRESTOR) 5 MG tablet, Take 1 tablet (5 mg total) by mouth daily., Disp: 90 tablet, Rfl: 3   vitamin C (ASCORBIC ACID) 500 MG tablet, Take 500 mg by mouth daily., Disp: , Rfl:   EXAM:  VITALS per patient if applicable:  GENERAL: alert, oriented, appears well and in no acute distress  HEENT: atraumatic, conjunttiva clear, no obvious abnormalities on inspection of external nose and  ears  NECK: normal movements of the head and neck  LUNGS: on inspection no signs of respiratory distress, breathing rate appears normal, no obvious gross SOB, gasping or wheezing  CV: no obvious cyanosis  MS: moves all visible extremities without noticeable abnormality  PSYCH/NEURO: pleasant and cooperative, no obvious depression or anxiety, speech and thought processing grossly intact  ASSESSMENT AND PLAN:  Discussed the following assessment and plan:  Weight loss counseling, encounter for - Plan: Semaglutide-Weight Management (WEGOVY) 1 MG/0.5ML SOAJ  Other obesity - Plan: Semaglutide-Weight Management (WEGOVY) 1 MG/0.5ML SOAJ - Will have him reach out to me via mychart and let me know when he is due to increase to 1.7 mg     I discussed the assessment and treatment plan with the patient. The patient was provided an opportunity to ask questions and all were answered. The patient agreed with the plan and demonstrated an understanding of the instructions.   The patient was advised to call back or seek an in-person evaluation if the symptoms worsen or if the condition fails to improve as anticipated.   Shirline Frees, NP

## 2022-11-12 ENCOUNTER — Encounter: Payer: Self-pay | Admitting: Adult Health

## 2022-11-13 ENCOUNTER — Other Ambulatory Visit: Payer: Self-pay | Admitting: Adult Health

## 2022-11-13 MED ORDER — WEGOVY 1.7 MG/0.75ML ~~LOC~~ SOAJ: 1.7000 mg | SUBCUTANEOUS | 0 refills | Status: DC

## 2022-12-06 ENCOUNTER — Other Ambulatory Visit: Payer: Self-pay | Admitting: Adult Health

## 2022-12-09 NOTE — Telephone Encounter (Signed)
Can you please advise? Per ov notes 10/21/2022 " Other obesity - Plan: Semaglutide-Weight Management (WEGOVY) 1 MG/0.5ML SOAJ - Will have him reach out to me via mychart and let me know when he is due to increase to 1.7 mg "

## 2023-01-29 ENCOUNTER — Encounter (HOSPITAL_COMMUNITY): Payer: Self-pay | Admitting: *Deleted

## 2023-02-03 ENCOUNTER — Telehealth: Payer: Self-pay

## 2023-02-03 NOTE — Transitions of Care (Post Inpatient/ED Visit) (Signed)
02/03/2023  Name: Bowyn Nevil MRN: [id number] DOB: [birth year]  Today's TOC FU Call Status: Today's TOC FU Call Status:: Successful TOC FU Call Completed TOC FU Call Complete Date: 02/03/23 Patient's Name and Date of Birth confirmed.  Transition Care Management Follow-up Telephone Call Date of Discharge: 02/02/23 Discharge Facility: Other ([facility]) Name of Other ([facility]) Discharge Facility: [facility] Type of Discharge: Inpatient Admission Primary Inpatient Discharge Diagnosis:: "acalculous cholecystitis" How have you been since you were released from the hospital?: Better (pt voices he is sdoing well-"havign just a little bit of pain"-current pain 3/10-has not taken any pain meds today-taking Tylenol & Oxycodone as needed, appetite good-no N&V, No BM yet-added Metamucil today, surg incision healing-no s/s of infection) Any questions or concerns?: No  Items Reviewed: Did you receive and understand the discharge instructions provided?: Yes Medications obtained,verified, and reconciled?: Yes (Medications Reviewed) Any new allergies since your discharge?: No Dietary orders reviewed?: Yes Type of Diet Ordered:: regular as tolerated Do you have support at home?: Yes People in Home: spouse Name of Support/Comfort Primary Source: Marcelino Duster  Medications Reviewed Today: Medications Reviewed Today     Reviewed by Charlyn Minerva, RN (Registered Nurse) on 02/03/23 at 1250  Med List Status: <None>   Medication Order Taking? Sig Documenting Provider Last Dose Status Informant  acetaminophen (TYLENOL) 500 MG tablet [id number] Yes Take 1,000 mg by mouth every 6 (six) hours as needed for mild pain (pain score 1-3). [provider] Taking Active Self  aspirin EC 81 MG tablet [id number] Yes Take 81 mg by mouth daily. [provider] Taking Active Spouse/Significant Other  Cholecalciferol (VITAMIN D) 2000 units CAPS [id number] Yes Take 2,000 Units by mouth daily.  [provider] Taking Active Spouse/Significant Other  Cyanocobalamin (VITAMIN B-12 PO) [id number] Yes Take 1 tablet by mouth daily. [provider] Taking Active   Multiple Vitamins-Minerals (ZINC PO) [id number] Yes Take 1 capsule by mouth daily. [provider] Taking Active   OVER THE COUNTER MEDICATION [id number] Yes Take 2 tablets by mouth daily. OTC Bariatric Multivitamin post-bariatric surgery [provider] Taking Active Spouse/Significant Other  oxyCODONE (OXY IR/ROXICODONE) 5 MG immediate release tablet [id number] Yes Take 5 mg by mouth every 4 (four) hours as needed for severe pain (pain score 7-10) or moderate pain (pain score 4-6). [provider] Taking Active Self  psyllium (HYDROCIL/METAMUCIL) 95 % PACK [id number] Yes Take 1 packet by mouth daily as needed for mild constipation. [provider] Taking Active Self  Red Yeast Rice 600 MG CAPS [id number] Yes Take 2 capsules by mouth daily. [provider] Taking Active   rosuvastatin (CRESTOR) 5 MG tablet [id number] No Take 1 tablet (5 mg total) by mouth daily.  Patient not taking: Reported on 02/03/2023   Shirline Frees, NP Not Taking Active   Semaglutide-Weight Management [facility]) 1.7 MG/0.75ML Ivory Broad [id number] Yes Inject 1.7 mg into the skin once a week. Nafziger, Kandee Keen, NP Taking Active   vitamin C (ASCORBIC ACID) 500 MG tablet [id number] Yes Take 500 mg by mouth daily. [provider] Taking Active             Home Care and Equipment/Supplies: Were Home Health Services Ordered?: NA Any new equipment or medical supplies ordered?: NA  Functional Questionnaire: Do you need assistance with bathing/showering or dressing?: No Do you need assistance with meal preparation?: No Do you need assistance with eating?: No Do you have difficulty maintaining continence: No Do you need assistance with getting out of bed/getting out  of a chair/moving?: No Do you have  difficulty managing or taking your medications?: No  Follow up appointments reviewed: PCP Follow-up appointment confirmed?: No (Pt declined) MD Provider Line Number:[phone] Given: No Specialist Hospital Follow-up appointment confirmed?: Yes Date of Specialist follow-up appointment?: 02/23/23 Follow-Up Specialty Provider:: Surgeon Do you need transportation to your follow-up appointment?: No (pt aware not to drive while still taking narcotic pain meds) Do you understand care options if your condition(s) worsen?: Yes-patient verbalized understanding  SDOH Interventions Today    Flowsheet Row Most Recent Value  SDOH Interventions   Food Insecurity Interventions Intervention Not Indicated  Transportation Interventions Intervention Not Indicated       Antionette Fairy, RN,BSN,CCM RN Care Manager Transitions of Care  [facility]  Direct Phone: [phone] Toll Free: [phone] Fax: [phone]

## 2023-03-02 ENCOUNTER — Encounter: Payer: Self-pay | Admitting: Adult Health

## 2023-03-03 NOTE — Telephone Encounter (Signed)
Please advise if pt needs an appt. Or if I can send the [facility] in

## 2023-03-17 ENCOUNTER — Other Ambulatory Visit: Payer: Self-pay | Admitting: Adult Health

## 2023-03-17 ENCOUNTER — Encounter: Payer: Self-pay | Admitting: Adult Health

## 2023-03-18 ENCOUNTER — Other Ambulatory Visit: Payer: Self-pay | Admitting: Adult Health

## 2023-03-18 MED ORDER — WEGOVY 2.4 MG/0.75ML ~~LOC~~ SOAJ: 2.4000 mg | SUBCUTANEOUS | 0 refills | Status: DC

## 2023-08-27 ENCOUNTER — Ambulatory Visit (INDEPENDENT_AMBULATORY_CARE_PROVIDER_SITE_OTHER): Admitting: Adult Health

## 2023-08-27 ENCOUNTER — Encounter: Payer: Self-pay | Admitting: Adult Health

## 2023-08-27 ENCOUNTER — Ambulatory Visit: Payer: Self-pay | Admitting: Adult Health

## 2023-08-27 ENCOUNTER — Telehealth: Payer: Self-pay | Admitting: *Deleted

## 2023-08-27 VITALS — BP 120/80 | HR 71 | Temp 97.9°F | Ht 73.25 in | Wt 279.0 lb

## 2023-08-27 DIAGNOSIS — Z713 Dietary counseling and surveillance: Secondary | ICD-10-CM | POA: Diagnosis not present

## 2023-08-27 DIAGNOSIS — Z9884 Bariatric surgery status: Secondary | ICD-10-CM | POA: Diagnosis not present

## 2023-08-27 DIAGNOSIS — I2510 Atherosclerotic heart disease of native coronary artery without angina pectoris: Secondary | ICD-10-CM

## 2023-08-27 DIAGNOSIS — Z0000 Encounter for general adult medical examination without abnormal findings: Secondary | ICD-10-CM

## 2023-08-27 DIAGNOSIS — Z125 Encounter for screening for malignant neoplasm of prostate: Secondary | ICD-10-CM | POA: Diagnosis not present

## 2023-08-27 DIAGNOSIS — Z6836 Body mass index (BMI) 36.0-36.9, adult: Secondary | ICD-10-CM

## 2023-08-27 LAB — COMPREHENSIVE METABOLIC PANEL WITH GFR
ALT: 17 U/L (ref 0–53)
AST: 16 U/L (ref 0–37)
Albumin: 4.4 g/dL (ref 3.5–5.2)
Alkaline Phosphatase: 61 U/L (ref 39–117)
BUN: 15 mg/dL (ref 6–23)
CO2: 26 meq/L (ref 19–32)
Calcium: 9.1 mg/dL (ref 8.4–10.5)
Chloride: 105 meq/L (ref 96–112)
Creatinine, Ser: 0.94 mg/dL (ref 0.40–1.50)
GFR: 92.16 mL/min (ref >60.00)
Glucose, Bld: 96 mg/dL (ref 70–99)
Potassium: 3.9 meq/L (ref 3.5–5.1)
Sodium: 140 meq/L (ref 135–145)
Total Bilirubin: 0.7 mg/dL (ref 0.2–1.2)
Total Protein: 7 g/dL (ref 6.0–8.3)

## 2023-08-27 LAB — CBC
HCT: 44.6 % (ref 39.0–52.0)
Hemoglobin: 15 g/dL (ref 13.0–17.0)
MCHC: 33.5 g/dL (ref 30.0–36.0)
MCV: 88.7 fl (ref 78.0–100.0)
Platelets: 226 10*3/uL (ref 150.0–400.0)
RBC: 5.03 Mil/uL (ref 4.22–5.81)
RDW: 13.3 % (ref 11.5–15.5)
WBC: 5.2 10*3/uL (ref 4.0–10.5)

## 2023-08-27 LAB — LIPID PANEL
Cholesterol: 239 mg/dL — ABNORMAL HIGH (ref 0–200)
HDL: 52.5 mg/dL (ref >39.00)
LDL Cholesterol: 161 mg/dL — ABNORMAL HIGH (ref 0–99)
NonHDL: 186.74
Total CHOL/HDL Ratio: 5
Triglycerides: 130 mg/dL (ref 0.0–149.0)
VLDL: 26 mg/dL (ref 0.0–40.0)

## 2023-08-27 LAB — IBC + FERRITIN
Ferritin: 37.3 ng/mL (ref 22.0–322.0)
Iron: 108 ug/dL (ref 42–165)
Saturation Ratios: 25.3 % (ref 20.0–50.0)
TIBC: 427 ug/dL (ref 250.0–450.0)
Transferrin: 305 mg/dL (ref 212.0–360.0)

## 2023-08-27 LAB — FOLATE: Folate: 11 ng/mL (ref >5.9)

## 2023-08-27 LAB — VITAMIN B12: Vitamin B-12: 264 pg/mL (ref 211–911)

## 2023-08-27 LAB — TSH: TSH: 1.37 u[IU]/mL (ref 0.35–5.50)

## 2023-08-27 LAB — VITAMIN D 25 HYDROXY (VIT D DEFICIENCY, FRACTURES): VITD: 25.96 ng/mL — ABNORMAL LOW (ref 30.00–100.00)

## 2023-08-27 LAB — PSA: PSA: 0.35 ng/mL (ref 0.10–4.00)

## 2023-08-27 NOTE — Telephone Encounter (Signed)
 Patient notified of update  and verbalized understanding.

## 2023-08-27 NOTE — Telephone Encounter (Signed)
 Copied from CRM [id number]. Topic: General - Other >> Aug 27, 2023  1:49 PM Annelle Kiel wrote: Reason for CRM: patient is needing a call back regarding a call from the office there where no notes on the patient chart

## 2023-08-27 NOTE — Progress Notes (Signed)
 Subjective:    Patient ID: Brandon Bailey, male    DOB: 05/20/1970, 53 y.o.   MRN: [id number]  HPI Patient presents for yearly preventative medicine examination. He is pleasant 53 year old male who  has a past medical history of Coronary artery disease, non-occlusive, GERD (gastroesophageal reflux disease), High cholesterol, Morbid obesity (HCC), Pre-diabetes, and Sleep apnea.  Obesity, other -history of gastric sleeve in March 2018.  He was also on phentermine in the past but did not see weight loss with this medication.  Back in March 2022 he was started on Wegovy  and had done well with this medication.  In October 2022 he was down to 239 pounds this was down from 283 pounds at its highest. He has not been able to get Wegovy  filled to denial from his insurance . 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 His weight has increased again. He does have new insurance and plans on checking with them to see if they cover weight loss medication.    Wt Readings from Last 3 Encounters:  08/27/23 279 lb (126.6 kg)  10/21/22 277 lb (125.6 kg)  09/16/22 282 lb (127.9 kg)   CAD -history of cardiac cath in July 2018.  This showed mild nonobstructive one-vessel coronary artery disease with 33% ostial left circumflex stenosis.  His EF at this time was 50 to 55%. Currently cholesterol panel improved with weight loss. He stopped simvastatin  due to body aches and started taking red yeast rice.  Lab Results  Component Value Date   CHOL 183 04/24/2022   HDL 46.40 04/24/2022   LDLCALC 115 (H) 04/24/2022   LDLDIRECT 151.2 04/21/2012   TRIG 111.0 04/24/2022   CHOLHDL 4 04/24/2022    All immunizations and health maintenance protocols were reviewed with the patient and needed orders were placed. Refused vaccinations.   Appropriate screening laboratory values were ordered for  the patient including screening of hyperlipidemia, renal function and hepatic function. If indicated by BPH, a PSA was ordered.  Medication reconciliation,  past medical history, social history, problem list and allergies were reviewed in detail with the patient  Goals were established with regard to weight loss, exercise, and  diet in compliance with medications  He is up to date on routine colon cancer screening   He has no acute issues today  Review of Systems  Constitutional: Negative.   HENT: Negative.    Eyes: Negative.   Respiratory: Negative.    Cardiovascular: Negative.   Gastrointestinal: Negative.   Endocrine: Negative.   Genitourinary: Negative.   Musculoskeletal: Negative.   Skin: Negative.   Allergic/Immunologic: Negative.   Neurological: Negative.   Hematological: Negative.   Psychiatric/Behavioral: Negative.    All other systems reviewed and are negative.  Past Medical History:  Diagnosis Date   Coronary artery disease, non-occlusive    a. LHC 10/10/2016: oLCx 30%, normal EF, nl LVEDP   GERD (gastroesophageal reflux disease)    High cholesterol    Morbid obesity (HCC)    Pre-diabetes    Sleep apnea    h/o, resolved since gastric surgery in 2018    Social History   Socioeconomic History   Marital status: Married    Spouse name: Not on file   Number of children: 2   Years of education: Not on file   Highest education level:  Associate degree: occupational, technical, or vocational program  Occupational History   Occupation: Curator  Tobacco Use   Smoking status: Never   Smokeless tobacco: Never  Vaping Use   Vaping status: Never Used  Substance and Sexual Activity   Alcohol use: No    Alcohol/week: 0.0 standard drinks of alcohol   Drug use: No   Sexual activity: Not on file  Other Topics Concern   Not on file  Social History Narrative   Works for [employer].    Social Drivers of Corporate investment banker Strain: Low  Risk  (08/20/2023)   Overall Financial Resource Strain (CARDIA)    Difficulty of Paying Living Expenses: Not hard at all  Food Insecurity: No Food Insecurity (08/20/2023)   Hunger Vital Sign    Worried About Running Out of Food in the Last Year: Never true    Ran Out of Food in the Last Year: Never true  Transportation Needs: No Transportation Needs (08/20/2023)   PRAPARE - Administrator, Civil Service (Medical): No    Lack of Transportation (Non-Medical): No  Physical Activity: Unknown (08/20/2023)   Exercise Vital Sign    Days of Exercise per Week: 0 days    Minutes of Exercise per Session: Not on file  Stress: No Stress Concern Present (08/20/2023)   [employer] of Occupational Health - Occupational Stress Questionnaire    Feeling of Stress : Not at all  Social Connections: Socially Integrated (08/20/2023)   Social Connection and Isolation Panel [NHANES]    Frequency of Communication with Friends and Family: More than three times a week    Frequency of Social Gatherings with Friends and Family: Twice a week    Attends Religious Services: More than 4 times per year    Active Member of Clubs or Organizations: No    Attends Engineer, structural: 1 to 4 times per year    Marital Status: Married  Catering manager Violence: Not on file    Past Surgical History:  Procedure Laterality Date   BREATH TEK H PYLORI N/A 08/09/2014   Procedure: BREATH TEK H PYLORI;  Surgeon: Jacolyn Matar, MD;  Location: [facility];  Service: General;  Laterality: N/A;   COLONOSCOPY     LAPAROSCOPIC GASTRIC SLEEVE RESECTION N/A 06/30/2016   Procedure: LAPAROSCOPIC GASTRIC SLEEVE RESECTION WITH UPPER ENDO;  Surgeon: Jacolyn Matar, MD;  Location: [facility];  Service: General;  Laterality: N/A;   LEFT HEART CATH AND CORONARY ANGIOGRAPHY N/A 10/10/2016   Procedure: Left Heart Cath and Coronary Angiography;  Surgeon: Wenona Hamilton, MD;  Location: [facility];  Service:  Cardiovascular;  Laterality: N/A;   UPPER GASTROINTESTINAL ENDOSCOPY     VASECTOMY  2012    Family History  Problem Relation Age of Onset   Cancer Father 25       melanoma   Allergies Father    High blood pressure Father    Heart disease Paternal Grandfather    Heart disease Paternal Grandmother    COPD Maternal Grandmother        non smoker    Stroke Maternal Grandfather    Colon cancer Neg Hx    Esophageal cancer Neg Hx    Rectal cancer Neg Hx    Stomach cancer Neg Hx     Allergies  Allergen Reactions   Simvastatin  Other (See Comments)    Body aches     Current Outpatient Medications on File Prior to Visit  Medication Sig Dispense Refill   acetaminophen  (TYLENOL ) 500 MG tablet Take 1,000 mg by mouth every 6 (six) hours as needed for mild pain (pain score 1-3).     aspirin  EC 81 MG tablet Take 81 mg by mouth daily.     Cholecalciferol (VITAMIN D ) 2000 units CAPS Take 2,000 Units by mouth daily.     Cyanocobalamin (VITAMIN B-12 PO) Take 1 tablet by mouth daily.     Multiple Vitamins-Minerals (ZINC PO) Take 1 capsule by mouth daily.     OVER THE COUNTER MEDICATION Take 2 tablets by mouth daily. OTC Bariatric Multivitamin post-bariatric surgery     Red Yeast Rice 600 MG CAPS Take 2 capsules by mouth daily.     Semaglutide -Weight Management (WEGOVY ) 2.4 MG/0.75ML SOAJ Inject 2.4 mg into the skin once a week. 9 mL 0   vitamin C (ASCORBIC ACID) 500 MG tablet Take 500 mg by mouth daily.     No current facility-administered medications on file prior to visit.    BP 120/80   Pulse 71   Temp 97.9 F (36.6 C) (Oral)   Ht 6' 1.25" (1.861 m)   Wt 279 lb (126.6 kg)   SpO2 98%   BMI 36.56 kg/m       Objective:   Physical Exam Vitals and nursing note reviewed.  Constitutional:      General: He is not in acute distress.    Appearance: Normal appearance. He is obese. He is not ill-appearing.  HENT:     Head: Normocephalic and atraumatic.     Right Ear: Tympanic membrane,  ear canal and external ear normal. There is no impacted cerumen.     Left Ear: Tympanic membrane, ear canal and external ear normal. There is no impacted cerumen.     Nose: Nose normal. No congestion or rhinorrhea.     Mouth/Throat:     Mouth: Mucous membranes are moist.     Pharynx: Oropharynx is clear.  Eyes:     Extraocular Movements: Extraocular movements intact.     Conjunctiva/sclera: Conjunctivae normal.     Pupils: Pupils are equal, round, and reactive to light.  Neck:     Vascular: No carotid bruit.  Cardiovascular:     Rate and Rhythm: Normal rate and regular rhythm.     Pulses: Normal pulses.     Heart sounds: No murmur heard.    No friction rub. No gallop.  Pulmonary:     Effort: Pulmonary effort is normal.     Breath sounds: Normal breath sounds.  Abdominal:     General: Abdomen is flat. Bowel sounds are normal. There is no distension.     Palpations: Abdomen is soft. There is no mass.     Tenderness: There is no abdominal tenderness. There is no guarding or rebound.     Hernia: No hernia is present.  Musculoskeletal:        General: Normal range of motion.     Cervical back: Normal range of motion and neck supple.  Lymphadenopathy:     Cervical: No cervical adenopathy.  Skin:    General: Skin is warm and dry.     Capillary Refill: Capillary refill takes less than 2 seconds.  Neurological:     General: No focal deficit present.     Mental Status: He is alert and oriented to person, place, and time.  Psychiatric:        Mood and Affect: Mood normal.  Behavior: Behavior normal.        Thought Content: Thought content normal.        Judgment: Judgment normal.        Assessment & Plan:  1. Routine general medical examination at a health care facility (Primary) Today patient counseled on age appropriate routine health concerns for screening and prevention, each reviewed and up to date or declined. Immunizations reviewed and up to date or declined. Labs  ordered and reviewed. Risk factors for depression reviewed and negative. Hearing function and visual acuity are intact. ADLs screened and addressed as needed. Functional ability and level of safety reviewed and appropriate. Education, counseling and referrals performed based on assessed risks today. Patient provided with a copy of personalized plan for preventive services. - Follow up in one year or sooner if needed - Work on lifestyle modifications.   2. Coronary artery disease, non-occlusive - Likely need to try a different statin  - Lipid panel; Future - TSH; Future - CBC; Future - Comprehensive metabolic panel with GFR; Future  3. Weight loss counseling, encounter for - Work on weight loss through diet and exercise  - Lipid panel; Future - TSH; Future - CBC; Future - Comprehensive metabolic panel with GFR; Future  4. Prostate cancer screening  - PSA; Future  5. S/P laparoscopic sleeve gastrectomy March 2018  - Lipid panel; Future - TSH; Future - CBC; Future - Comprehensive metabolic panel with GFR; Future - Vitamin B12; Future - VITAMIN D  25 Hydroxy (Vit-D Deficiency, Fractures); Future - PTH, Intact and Calcium ; Future - Folate; Future - Vitamin A ; Future - IBC + Ferritin; Future  6. BMI 36.0-36.9,adult - Will wait to hear from him on whether his insurance covers wegovy  or zepbound   Alto Atta, NP

## 2023-08-28 ENCOUNTER — Encounter: Payer: Self-pay | Admitting: Adult Health

## 2023-08-28 ENCOUNTER — Other Ambulatory Visit: Payer: Self-pay | Admitting: Adult Health

## 2023-08-28 MED ORDER — WEGOVY 0.25 MG/0.5ML ~~LOC~~ SOAJ
0.2500 mg | SUBCUTANEOUS | 0 refills | Status: DC
Start: 2023-08-28 — End: 2023-09-03

## 2023-08-28 NOTE — Telephone Encounter (Signed)
**Note De-identified  [place] Obfuscation** Please advise 

## 2023-08-31 LAB — PTH, INTACT AND CALCIUM
Calcium: 9.1 mg/dL (ref 8.6–10.3)
PTH: 16 pg/mL (ref 16–77)

## 2023-08-31 LAB — VITAMIN A: Vitamin A (Retinoic Acid): 49 ug/dL (ref 38–98)

## 2023-09-03 ENCOUNTER — Telehealth: Payer: Self-pay

## 2023-09-03 ENCOUNTER — Other Ambulatory Visit: Payer: Self-pay | Admitting: Adult Health

## 2023-09-03 ENCOUNTER — Other Ambulatory Visit (HOSPITAL_COMMUNITY): Payer: Self-pay

## 2023-09-03 MED ORDER — TIRZEPATIDE-WEIGHT MANAGEMENT 2.5 MG/0.5ML ~~LOC~~ SOLN: 2.5000 mg | SUBCUTANEOUS | 0 refills | Status: DC

## 2023-09-03 NOTE — Telephone Encounter (Signed)
 Pharmacy Patient Advocate Encounter   Received notification from CoverMyMeds that prior authorization for Zepbound 2.5 is required/requested.   Insurance verification completed.   The patient is insured through [facility] .   Per test claim: PA required; PA submitted to above mentioned insurance via CoverMyMeds Key/confirmation #/EOC [id number] Status is pending

## 2023-09-03 NOTE — Telephone Encounter (Signed)
 Pharmacy Patient Advocate Encounter  Received notification from [employer] that Prior Authorization for Zepbound 2.5 has been DENIED.  Full denial letter will be uploaded to the media tab. See denial reason below.   PA #/Case ID/Reference #: [id number]

## 2023-09-03 NOTE — Telephone Encounter (Signed)
Tried to call pt to advise of message below but no answer.

## 2023-09-10 ENCOUNTER — Other Ambulatory Visit: Payer: Self-pay | Admitting: Adult Health

## 2023-09-10 MED ORDER — TIRZEPATIDE-WEIGHT MANAGEMENT 2.5 MG/0.5ML ~~LOC~~ SOLN: 2.5000 mg | SUBCUTANEOUS | 0 refills | Status: DC

## 2023-09-10 NOTE — Telephone Encounter (Signed)
 Okay for refill?

## 2023-09-23 ENCOUNTER — Other Ambulatory Visit (HOSPITAL_COMMUNITY): Payer: Self-pay

## 2023-09-23 ENCOUNTER — Telehealth: Payer: Self-pay

## 2023-09-23 ENCOUNTER — Other Ambulatory Visit: Payer: Self-pay | Admitting: Adult Health

## 2023-09-23 DIAGNOSIS — Z6836 Body mass index (BMI) 36.0-36.9, adult: Secondary | ICD-10-CM

## 2023-09-23 DIAGNOSIS — E66811 Obesity, class 1: Secondary | ICD-10-CM

## 2023-09-23 MED ORDER — WEGOVY 0.25 MG/0.5ML ~~LOC~~ SOAJ: 0.2500 mg | SUBCUTANEOUS | 0 refills | Status: DC

## 2023-09-23 NOTE — Telephone Encounter (Signed)
 Pharmacy Patient Advocate Encounter   Received notification from CoverMyMeds that prior authorization for Wegovy  0.25MG /0.5ML auto-injectors is required/requested.   Insurance verification completed.   The patient is insured through [facility] .   Per test claim: PA required; PA started via CoverMyMeds. KEY [id number] . Waiting for clinical questions to populate.

## 2023-09-23 NOTE — Telephone Encounter (Signed)
 Noted

## 2023-09-24 NOTE — Telephone Encounter (Signed)
 PLEASE BE ADVISED Clinical questions have been answered and PA submitted.TO PLAN. PA currently Pending.

## 2023-09-25 NOTE — Telephone Encounter (Signed)
 Copied from CRM [id number]. Topic: General - Other >> Sep 25, 2023  2:50 PM [place] E wrote: Reason for CRM: Patient was returning a call from [place], patient saw that his Wegovy  has been approved, but he just wanted to make sure if that was it. Callback number for patient is [phone].

## 2023-09-25 NOTE — Telephone Encounter (Signed)
 Left message to return phone call.

## 2023-09-25 NOTE — Telephone Encounter (Signed)
 Called pt back to advised nothing else was needed. Pt verbalized understanding.

## 2023-09-25 NOTE — Telephone Encounter (Signed)
 Pharmacy Patient Advocate Encounter  Received notification from [employer] that Prior Authorization for Wegovy  0.25MG /0.5ML auto-injectors has been APPROVED from 09/24/2023 to 03/25/2024 SEE OUTCOME BELOW  Personalized support and financial assistance may be available through the [facility] WeGoTogether program. For more information, and to see program requirements, click on the More Info button to the right.  Message from plan: Request Reference Number: [id number]. WEGOVY  INJ 0.25MG  is approved through 03/25/2024. Your patient may now fill this prescription and it will be covered.. Authorization Expiration Date: March 25, 2024.  PA #/Case ID/Reference #: [id number]

## 2023-10-02 ENCOUNTER — Telehealth: Payer: Self-pay

## 2023-10-02 ENCOUNTER — Other Ambulatory Visit (HOSPITAL_COMMUNITY): Payer: Self-pay

## 2023-10-02 NOTE — Telephone Encounter (Signed)
 Patient has active prior approval for Wegovy  (Last Filled 09/24/23) see encounter 09/25/23.  Pharmacy Patient Advocate Encounter   Received notification from CoverMyMeds that prior authorization for Zepbound  2.5 is required/requested.   Insurance verification completed.   The patient is insured through [facility] .   Per test claim: patient already is on Wegovy . Please advise

## 2023-11-05 ENCOUNTER — Telehealth (INDEPENDENT_AMBULATORY_CARE_PROVIDER_SITE_OTHER): Admitting: Adult Health

## 2023-11-05 ENCOUNTER — Encounter: Payer: Self-pay | Admitting: Adult Health

## 2023-11-05 VITALS — Ht 73.25 in | Wt 272.0 lb

## 2023-11-05 DIAGNOSIS — Z6836 Body mass index (BMI) 36.0-36.9, adult: Secondary | ICD-10-CM

## 2023-11-05 DIAGNOSIS — E66811 Obesity, class 1: Secondary | ICD-10-CM

## 2023-11-05 MED ORDER — ZEPBOUND 7.5 MG/0.5ML ~~LOC~~ SOAJ: 7.5000 mg | SUBCUTANEOUS | 0 refills | Status: AC

## 2023-11-05 MED ORDER — TIRZEPATIDE-WEIGHT MANAGEMENT 5 MG/0.5ML ~~LOC~~ SOAJ: 5.0000 mg | SUBCUTANEOUS | 0 refills | Status: AC

## 2023-11-05 NOTE — Progress Notes (Signed)
 Virtual Visit via Video Note  I connected with Brandon Bailey on 11/05/23 at  7:00 AM EDT by a video enabled telemedicine application and verified that I am speaking with the correct person using two identifiers.  Location patient: home Location provider:work or home office Persons participating in the virtual visit: patient, provider  I discussed the limitations of evaluation and management by telemedicine and the availability of in person appointments. The patient expressed understanding and agreed to proceed.   HPI: He has been evaluated today for follow-up regarding obesity and weight loss management.  He does have a history of gastric sleeve in March 2018.  In March 2022 he was started on Wegovy  and did well on this medication ultimately was able to lose roughly 44 pounds.  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  His weight then went back up and then we started him on Wegovy  0.25 mg but then his insurance approved Zepbound  so he decided to try Zepbound . He has been doing well with the 2.5 mg dose and has not had any side effects.   He is down 5 pounds since starting Zepbound .  Wt Readings from Last 3 Encounters:  11/05/23 272 lb (123.4 kg)  08/27/23 279 lb (126.6 kg)  10/21/22 277 lb (125.6 kg)      ROS: See pertinent positives and negatives per HPI.  Past Medical History:  Diagnosis Date   Coronary artery disease, non-occlusive    a. LHC 10/10/2016: oLCx 30%, normal EF, nl LVEDP   GERD (gastroesophageal reflux disease)    High cholesterol    Morbid obesity (HCC)    Pre-diabetes    Sleep apnea    h/o, resolved since gastric surgery in 2018    Past Surgical History:  Procedure Laterality Date   BREATH TEK H PYLORI N/A 08/09/2014   Procedure: BREATH TEK H PYLORI;  Surgeon: Donnice Lunger, MD;  Location: [facility];   Service: General;  Laterality: N/A;   COLONOSCOPY     LAPAROSCOPIC GASTRIC SLEEVE RESECTION N/A 06/30/2016   Procedure: LAPAROSCOPIC GASTRIC SLEEVE RESECTION WITH UPPER ENDO;  Surgeon: Donnice Lunger, MD;  Location: [facility];  Service: General;  Laterality: N/A;   LEFT HEART CATH AND CORONARY ANGIOGRAPHY N/A 10/10/2016   Procedure: Left Heart Cath and Coronary Angiography;  Surgeon: Darron Deatrice LABOR, MD;  Location: [facility];  Service: Cardiovascular;  Laterality: N/A;   UPPER GASTROINTESTINAL ENDOSCOPY     VASECTOMY  2012    Family History  Problem Relation Age of Onset   Cancer Father 4       melanoma   Allergies Father    High blood pressure Father    Heart disease Paternal Grandfather    Heart disease Paternal Grandmother    COPD Maternal Grandmother        non smoker    Stroke Maternal Grandfather    Colon cancer Neg Hx    Esophageal cancer Neg Hx    Rectal cancer Neg Hx    Stomach cancer Neg Hx        Current Outpatient Medications:    acetaminophen  (TYLENOL ) 500 MG tablet, Take 1,000 mg by mouth every 6 (six) hours as needed for mild pain (pain score 1-3)., Disp: , Rfl:    aspirin  EC 81 MG tablet, Take 81 mg by mouth daily., Disp: ,  Rfl:    Cholecalciferol (VITAMIN D ) 2000 units CAPS, Take 2,000 Units by mouth daily., Disp: , Rfl:    Cyanocobalamin  (VITAMIN B-12 PO), Take 1 tablet by mouth daily., Disp: , Rfl:    Multiple Vitamins-Minerals (ZINC PO), Take 1 capsule by mouth daily., Disp: , Rfl:    OVER THE COUNTER MEDICATION, Take 2 tablets by mouth daily. OTC Bariatric Multivitamin post-bariatric surgery, Disp: , Rfl:    Red Yeast Rice 600 MG CAPS, Take 2 capsules by mouth daily., Disp: , Rfl:    tirzepatide  (ZEPBOUND ) 5 MG/0.5ML Pen, Inject 5 mg into the skin once a week., Disp: 2 mL, Rfl: 0   tirzepatide  (ZEPBOUND ) 7.5 MG/0.5ML Pen, Inject 7.5 mg into the skin once a week., Disp: 2 mL, Rfl: 0   vitamin C (ASCORBIC ACID) 500 MG tablet, Take 500 mg by mouth  daily., Disp: , Rfl:   EXAM:  VITALS per patient if applicable:  GENERAL: alert, oriented, appears well and in no acute distress  HEENT: atraumatic, conjunttiva clear, no obvious abnormalities on inspection of external nose and ears  NECK: normal movements of the head and neck  LUNGS: on inspection no signs of respiratory distress, breathing rate appears normal, no obvious gross SOB, gasping or wheezing  CV: no obvious cyanosis  MS: moves all visible extremities without noticeable abnormality  PSYCH/NEURO: pleasant and cooperative, no obvious depression or anxiety, speech and thought processing grossly intact  ASSESSMENT AND PLAN:  Discussed the following assessment and plan:  1. BMI 36.0-36.9,adult (Primary) - I will increase his Zepbound  to 5 mg weekly for a month and then he can increase to 7.5 mg weekly. Follow up in 60 days.  - Continue to eat healthy and exercise  - tirzepatide  (ZEPBOUND ) 7.5 MG/0.5ML Pen; Inject 7.5 mg into the skin once a week.  Dispense: 2 mL; Refill: 0 - tirzepatide  (ZEPBOUND ) 5 MG/0.5ML Pen; Inject 5 mg into the skin once a week.  Dispense: 2 mL; Refill: 0  2. Class 1 obesity  - tirzepatide  (ZEPBOUND ) 7.5 MG/0.5ML Pen; Inject 7.5 mg into the skin once a week.  Dispense: 2 mL; Refill: 0 - tirzepatide  (ZEPBOUND ) 5 MG/0.5ML Pen; Inject 5 mg into the skin once a week.  Dispense: 2 mL; Refill: 0  I discussed the assessment and treatment plan with the patient. The patient was provided an opportunity to ask questions and all were answered. The patient agreed with the plan and demonstrated an understanding of the instructions.   The patient was advised to call back or seek an in-person evaluation if the symptoms worsen or if the condition fails to improve as anticipated.   Marnell Mcdaniel, NP

## 2023-12-24 ENCOUNTER — Telehealth (INDEPENDENT_AMBULATORY_CARE_PROVIDER_SITE_OTHER): Admitting: Adult Health

## 2023-12-24 ENCOUNTER — Encounter: Payer: Self-pay | Admitting: Adult Health

## 2023-12-24 VITALS — Ht 73.0 in | Wt 266.0 lb

## 2023-12-24 DIAGNOSIS — E66811 Obesity, class 1: Secondary | ICD-10-CM

## 2023-12-24 DIAGNOSIS — Z6835 Body mass index (BMI) 35.0-35.9, adult: Secondary | ICD-10-CM | POA: Diagnosis not present

## 2023-12-24 DIAGNOSIS — Z713 Dietary counseling and surveillance: Secondary | ICD-10-CM

## 2023-12-24 MED ORDER — ZEPBOUND 10 MG/0.5ML ~~LOC~~ SOAJ: 10.0000 mg | SUBCUTANEOUS | 0 refills | Status: DC

## 2023-12-24 NOTE — Progress Notes (Signed)
 Virtual Visit via Video Note  I connected with Brandon Bailey on 12/24/23 at  2:00 PM EDT by a video enabled telemedicine application and verified that I am speaking with the correct person using two identifiers.  Location patient: home Location provider:work or home office Persons participating in the virtual visit: patient, provider  I discussed the limitations of evaluation and management by telemedicine and the availability of in person appointments. The patient expressed understanding and agreed to proceed.   HPI: He has been evaluated today for follow-up regarding obesity and weight loss management.  He does have a history of gastric sleeve in March 2018.  In March 2022 he was started on Wegovy  and did well on this medication ultimately was able to lose roughly 44 pounds.  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  His weight then went back up and then we started him on Wegovy  0.25 mg but then his insurance approved Zepbound  so he decided to try Zepbound . He has been doing well and has titrated to the 7.5  mg dose and has not had any side effects.   He is down 13 pounds since starting Zepbound .   Wt Readings from Last 10 Encounters:  12/24/23 266 lb (120.7 kg)  11/05/23 272 lb (123.4 kg)  08/27/23 279 lb (126.6 kg)  10/21/22 277 lb (125.6 kg)  09/16/22 282 lb (127.9 kg)  04/24/22 275 lb (124.7 kg)  04/04/21 252 lb (114.3 kg)  03/19/21 253 lb (114.8 kg)  12/25/20 249 lb (112.9 kg)  10/09/20 267 lb (121.1 kg)     ROS: See pertinent positives and negatives per HPI.  Past Medical History:  Diagnosis Date   Coronary artery disease, non-occlusive    a. LHC 10/10/2016: oLCx 30%, normal EF, nl LVEDP   GERD (gastroesophageal reflux disease)    High cholesterol    Morbid obesity (HCC)    Pre-diabetes    Sleep apnea    h/o,  resolved since gastric surgery in 2018    Past Surgical History:  Procedure Laterality Date   BREATH TEK H PYLORI N/A 08/09/2014   Procedure: BREATH TEK H PYLORI;  Surgeon: Donnice Lunger, MD;  Location: [facility];  Service: General;  Laterality: N/A;   COLONOSCOPY     LAPAROSCOPIC GASTRIC SLEEVE RESECTION N/A 06/30/2016   Procedure: LAPAROSCOPIC GASTRIC SLEEVE RESECTION WITH UPPER ENDO;  Surgeon: Donnice Lunger, MD;  Location: [facility];  Service: General;  Laterality: N/A;   LEFT HEART CATH AND CORONARY ANGIOGRAPHY N/A 10/10/2016   Procedure: Left Heart Cath and Coronary Angiography;  Surgeon: Darron Deatrice LABOR, MD;  Location: [facility];  Service: Cardiovascular;  Laterality: N/A;   UPPER GASTROINTESTINAL ENDOSCOPY     VASECTOMY  2012    Family History  Problem Relation Age of Onset   Cancer Father 26       melanoma   Allergies Father    High blood pressure Father    Heart disease Paternal Grandfather    Heart disease Paternal Grandmother    COPD Maternal Grandmother        non smoker    Stroke Maternal Grandfather    Colon cancer Neg Hx    Esophageal cancer Neg Hx    Rectal cancer Neg Hx    Stomach cancer Neg Hx  Current Outpatient Medications:    acetaminophen  (TYLENOL ) 500 MG tablet, Take 1,000 mg by mouth every 6 (six) hours as needed for mild pain (pain score 1-3)., Disp: , Rfl:    aspirin  EC 81 MG tablet, Take 81 mg by mouth daily., Disp: , Rfl:    Cholecalciferol (VITAMIN D ) 2000 units CAPS, Take 2,000 Units by mouth daily., Disp: , Rfl:    Cyanocobalamin  (VITAMIN B-12 PO), Take 1 tablet by mouth daily., Disp: , Rfl:    Multiple Vitamins-Minerals (ZINC PO), Take 1 capsule by mouth daily., Disp: , Rfl:    OVER THE COUNTER MEDICATION, Take 2 tablets by mouth daily. OTC Bariatric Multivitamin post-bariatric surgery, Disp: , Rfl:    Red Yeast Rice 600 MG CAPS, Take 2 capsules by mouth daily., Disp: , Rfl:    vitamin C (ASCORBIC ACID) 500 MG tablet, Take  500 mg by mouth daily., Disp: , Rfl:    voriconazole (VFEND) 200 MG tablet, SMARTSIG:1 Tablet(s) By Mouth Every 12 Hours, Disp: , Rfl:    ZEPBOUND  7.5 MG/0.5ML Pen, SMARTSIG:7.5 Milligram(s) SUB-Q Once a Week, Disp: , Rfl:   EXAM:  VITALS per patient if applicable:  GENERAL: alert, oriented, appears well and in no acute distress  HEENT: atraumatic, conjunttiva clear, no obvious abnormalities on inspection of external nose and ears  NECK: normal movements of the head and neck  LUNGS: on inspection no signs of respiratory distress, breathing rate appears normal, no obvious gross SOB, gasping or wheezing  CV: no obvious cyanosis  MS: moves all visible extremities without noticeable abnormality  PSYCH/NEURO: pleasant and cooperative, no obvious depression or anxiety, speech and thought processing grossly intact  ASSESSMENT AND PLAN:  Discussed the following assessment and plan:  1. Weight loss counseling, encounter for (Primary) - Will increase his Zepbound  to 10 mg weekly x 3 months. Continue to exercise and eat healthy  - Follow up in December 2025 for reevaluation  - tirzepatide  (ZEPBOUND ) 10 MG/0.5ML Pen; Inject 10 mg into the skin once a week.  Dispense: 6 mL; Refill: 0  2. Class 1 obesity  - tirzepatide  (ZEPBOUND ) 10 MG/0.5ML Pen; Inject 10 mg into the skin once a week.  Dispense: 6 mL; Refill: 0  3. BMI 35.0-35.9,adult  - tirzepatide  (ZEPBOUND ) 10 MG/0.5ML Pen; Inject 10 mg into the skin once a week.  Dispense: 6 mL; Refill: 0  I discussed the assessment and treatment plan with the patient. The patient was provided an opportunity to ask questions and all were answered. The patient agreed with the plan and demonstrated an understanding of the instructions.   The patient was advised to call back or seek an in-person evaluation if the symptoms worsen or if the condition fails to improve as anticipated.   [name], NP

## 2024-01-26 ENCOUNTER — Other Ambulatory Visit (HOSPITAL_COMMUNITY): Payer: Self-pay

## 2024-01-26 ENCOUNTER — Telehealth: Payer: Self-pay

## 2024-01-26 NOTE — Telephone Encounter (Signed)
 Pharmacy Patient Advocate Encounter   Received notification from Onbase that prior authorization for Zepbound  10 is required/requested.   Insurance verification completed.   The patient is insured through [employer].   Per test claim: PA required; PA submitted to above mentioned insurance via Latent Key/confirmation #/EOC [id number] Status is pending

## 2024-01-26 NOTE — Telephone Encounter (Signed)
 Pharmacy Patient Advocate Encounter  Received notification from [facility] that Prior Authorization for Zepbound  10 has been APPROVED from 12/27/23 to 01/25/25. Ran test claim, Copay is $35.00. This test claim was processed through [facility]- copay amounts may vary at other pharmacies due to pharmacy/plan contracts, or as the patient moves through the different stages of their insurance plan.   PA #/Case ID/Reference #: # [id number]

## 2024-01-27 NOTE — Telephone Encounter (Signed)
 Patient notified of update  and verbalized understanding.

## 2024-03-09 ENCOUNTER — Telehealth: Payer: Self-pay

## 2024-03-09 ENCOUNTER — Other Ambulatory Visit (HOSPITAL_COMMUNITY): Payer: Self-pay

## 2024-03-09 NOTE — Telephone Encounter (Signed)
 Pharmacy Patient Advocate Encounter   Received notification from Onbase that prior authorization for Wegovy  0.25 is required/requested.   Insurance verification completed.   The patient is insured through [employer].   Patient has current approved PA for Zepbound  that expires 01/25/25. Please advise

## 2024-03-09 NOTE — Telephone Encounter (Signed)
Left detailed message informing pt of update. 

## 2024-03-23 ENCOUNTER — Telehealth: Admitting: Adult Health

## 2024-03-23 VITALS — Wt 259.0 lb

## 2024-03-23 DIAGNOSIS — Z713 Dietary counseling and surveillance: Secondary | ICD-10-CM | POA: Diagnosis not present

## 2024-03-23 DIAGNOSIS — E66811 Obesity, class 1: Secondary | ICD-10-CM | POA: Diagnosis not present

## 2024-03-23 DIAGNOSIS — Z6835 Body mass index (BMI) 35.0-35.9, adult: Secondary | ICD-10-CM | POA: Diagnosis not present

## 2024-03-23 MED ORDER — TIRZEPATIDE 12.5 MG/0.5ML ~~LOC~~ SOAJ: 12.5000 mg | SUBCUTANEOUS | 0 refills | Status: DC

## 2024-03-23 MED ORDER — ZEPBOUND 12.5 MG/0.5ML ~~LOC~~ SOAJ: 12.5000 mg | SUBCUTANEOUS | 0 refills | Status: AC

## 2024-03-23 NOTE — Progress Notes (Signed)
 Virtual Visit via Video Note  I connected with Brandon Bailey on 03/23/2024 at  7:00 AM EST by a video enabled telemedicine application and verified that I am speaking with the correct person using two identifiers.  Location patient: home Location provider:work or home office Persons participating in the virtual visit: patient, provider  I discussed the limitations of evaluation and management by telemedicine and the availability of in person appointments. The patient expressed understanding and agreed to proceed.   HPI: 53 year old male who  has a past medical history of Coronary artery disease, non-occlusive, GERD (gastroesophageal reflux disease), High cholesterol, Morbid obesity (HCC), Pre-diabetes, and Sleep apnea.  He presents to the office today for folllow up regarding obesity and weight loss management    He does have a history of gastric sleeve in March 2018.  In March 2022 he was started on Wegovy  and did well on this medication ultimately was able to lose roughly 44 pounds.  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  His weight then went back up and then we started him on Wegovy  0.25 mg but then his insurance approved Zepbound  so he decided to try Zepbound . He has been doing well and has titrated to the 10 mg dose and has not had any side effects.   He is down 20 pounds since starting Zepbound .   Wt Readings from Last 3 Encounters:  03/23/24 259 lb (117.5 kg)  12/24/23 266 lb (120.7 kg)  11/05/23 272 lb (123.4 kg)    ROS: See pertinent positives and negatives per HPI.  Past Medical History:  Diagnosis Date   Coronary artery disease, non-occlusive    a. LHC 10/10/2016: oLCx 30%, normal EF, nl LVEDP   GERD (gastroesophageal reflux disease)    High cholesterol    Morbid obesity (HCC)    Pre-diabetes    Sleep apnea     h/o, resolved since gastric surgery in 2018    Past Surgical History:  Procedure Laterality Date   BREATH TEK H PYLORI N/A 08/09/2014   Procedure: BREATH TEK H PYLORI;  Surgeon: Donnice Lunger, MD;  Location: [facility];  Service: General;  Laterality: N/A;   COLONOSCOPY     LAPAROSCOPIC GASTRIC SLEEVE RESECTION N/A 06/30/2016   Procedure: LAPAROSCOPIC GASTRIC SLEEVE RESECTION WITH UPPER ENDO;  Surgeon: Donnice Lunger, MD;  Location: [facility];  Service: General;  Laterality: N/A;   LEFT HEART CATH AND CORONARY ANGIOGRAPHY N/A 10/10/2016   Procedure: Left Heart Cath and Coronary Angiography;  Surgeon: Darron Deatrice LABOR, MD;  Location: [facility];  Service: Cardiovascular;  Laterality: N/A;   UPPER GASTROINTESTINAL ENDOSCOPY     VASECTOMY  2012    Family History  Problem Relation Age of Onset   Cancer Father 85       melanoma   Allergies Father    High blood pressure Father    Heart disease Paternal Grandfather    Heart disease Paternal Grandmother    COPD Maternal Grandmother        non smoker    Stroke Maternal Grandfather    Colon cancer Neg Hx    Esophageal cancer Neg Hx    Rectal cancer Neg Hx    Stomach cancer Neg Hx       Current Medications[1]  EXAM:  VITALS per patient if applicable:  GENERAL: alert, oriented, appears well and in no acute distress  HEENT: atraumatic, conjunttiva clear, no obvious abnormalities on inspection of external nose and ears  NECK: normal movements of the head and neck  LUNGS: on inspection no signs of respiratory distress, breathing rate appears normal, no obvious gross SOB, gasping or wheezing  CV: no obvious cyanosis  MS: moves all visible extremities without noticeable abnormality  PSYCH/NEURO: pleasant and cooperative, no obvious depression or anxiety, speech and thought processing grossly intact  ASSESSMENT AND PLAN:  Discussed the following assessment and plan:  1. Weight loss counseling, encounter for  (Primary) - He has lost 7 pounds over the last three months for about 20 lbs total. He would like to go up on the dose to 12.5 mg for a month and reevaluate  - tirzepatide  (ZEPBOUND ) 12.5 MG/0.5ML Pen; Inject 12.5 mg into the skin once a week.  Dispense: 2 mL; Refill: 0  2. Class 1 obesity  - tirzepatide  (ZEPBOUND ) 12.5 MG/0.5ML Pen; Inject 12.5 mg into the skin once a week.  Dispense: 2 mL; Refill: 0  3. BMI 35.0-35.9,adult  - tirzepatide  (ZEPBOUND ) 12.5 MG/0.5ML Pen; Inject 12.5 mg into the skin once a week.  Dispense: 2 mL; Refill: 0     I discussed the assessment and treatment plan with the patient. The patient was provided an opportunity to ask questions and all were answered. The patient agreed with the plan and demonstrated an understanding of the instructions.   The patient was advised to call back or seek an in-person evaluation if the symptoms worsen or if the condition fails to improve as anticipated.   Darleene Shape, NP      [1]  Current Outpatient Medications:    acetaminophen  (TYLENOL ) 500 MG tablet, Take 1,000 mg by mouth every 6 (six) hours as needed for mild pain (pain score 1-3)., Disp: , Rfl:    aspirin  EC 81 MG tablet, Take 81 mg by mouth daily., Disp: , Rfl:    Cholecalciferol (VITAMIN D ) 2000 units CAPS, Take 2,000 Units by mouth daily., Disp: , Rfl:    Cyanocobalamin  (VITAMIN B-12 PO), Take 1 tablet by mouth daily., Disp: , Rfl:    Multiple Vitamins-Minerals (ZINC PO), Take 1 capsule by mouth daily., Disp: , Rfl:    OVER THE COUNTER MEDICATION, Take 2 tablets by mouth daily. OTC Bariatric Multivitamin post-bariatric surgery, Disp: , Rfl:    Red Yeast Rice 600 MG CAPS, Take 2 capsules by mouth daily., Disp: , Rfl:    tirzepatide  (ZEPBOUND ) 10 MG/0.5ML Pen, Inject 10 mg into the skin once a week., Disp: 6 mL, Rfl: 0   vitamin C (ASCORBIC ACID) 500 MG tablet, Take 500 mg by mouth daily., Disp: , Rfl:    voriconazole (VFEND) 200 MG tablet, SMARTSIG:1 Tablet(s) By  Mouth Every 12 Hours, Disp: , Rfl:

## 2024-03-24 ENCOUNTER — Other Ambulatory Visit (HOSPITAL_COMMUNITY): Payer: Self-pay

## 2024-04-27 ENCOUNTER — Telehealth: Admitting: Adult Health

## 2024-04-27 ENCOUNTER — Encounter: Payer: Self-pay | Admitting: Adult Health

## 2024-04-27 VITALS — Ht 73.0 in | Wt 255.0 lb

## 2024-04-27 DIAGNOSIS — Z6835 Body mass index (BMI) 35.0-35.9, adult: Secondary | ICD-10-CM

## 2024-04-27 DIAGNOSIS — E66811 Obesity, class 1: Secondary | ICD-10-CM | POA: Diagnosis not present

## 2024-04-27 DIAGNOSIS — Z713 Dietary counseling and surveillance: Secondary | ICD-10-CM

## 2024-04-27 MED ORDER — ZEPBOUND 15 MG/0.5ML ~~LOC~~ SOAJ: 15.0000 mg | SUBCUTANEOUS | 1 refills | Status: AC

## 2024-04-27 NOTE — Progress Notes (Signed)
 Virtual Visit via Video Note  I connected with Brandon Bailey on 04/27/24 at  7:00 AM EST by a video enabled telemedicine application and verified that I am speaking with the correct person using two identifiers.  Location patient: home Location provider:work or home office Persons participating in the virtual visit: patient, provider  I discussed the limitations of evaluation and management by telemedicine and the availability of in person appointments. The patient expressed understanding and agreed to proceed.   HPI: He presents to the office today for folllow up regarding obesity and weight loss management    He does have a history of gastric sleeve in March 2018.  In March 2022 he was started on Wegovy  and did well on this medication ultimately was able to lose roughly 44 pounds.  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  His weight then went back up and then we started him on Wegovy  0.25 mg but then his insurance approved Zepbound  so he decided to try Zepbound . He has been doing well and has titrated to the 12.5 mg dose and has not had any side effects except for mild constipation which he takes magnesium for.    Wt Readings from Last 3 Encounters:  04/27/24 255 lb (115.7 kg)  03/23/24 259 lb (117.5 kg)  12/24/23 266 lb (120.7 kg)   ROS: See pertinent positives and negatives per HPI.  Past Medical History:  Diagnosis Date   Coronary artery disease, non-occlusive    a. LHC 10/10/2016: oLCx 30%, normal EF, nl LVEDP   GERD (gastroesophageal reflux disease)    High cholesterol    Morbid obesity (HCC)    Pre-diabetes    Sleep apnea    h/o, resolved since gastric surgery in 2018    Past Surgical History:  Procedure Laterality Date   BREATH TEK H PYLORI N/A 08/09/2014   Procedure: BREATH TEK H PYLORI;  Surgeon: Donnice Lunger, MD;  Location: [facility];  Service: General;  Laterality: N/A;   COLONOSCOPY     LAPAROSCOPIC GASTRIC SLEEVE RESECTION N/A 06/30/2016   Procedure: LAPAROSCOPIC GASTRIC SLEEVE RESECTION WITH UPPER ENDO;  Surgeon: Donnice Lunger, MD;  Location: [facility];  Service: General;  Laterality: N/A;   LEFT HEART CATH AND CORONARY ANGIOGRAPHY N/A 10/10/2016   Procedure: Left Heart Cath and Coronary Angiography;  Surgeon: Darron Deatrice LABOR, MD;  Location: [facility];  Service: Cardiovascular;  Laterality: N/A;   UPPER GASTROINTESTINAL ENDOSCOPY     VASECTOMY  2012    Family History  Problem Relation Age of Onset   Cancer Father 36       melanoma   Allergies Father    High blood pressure Father    Heart disease Paternal Grandfather    Heart disease Paternal Grandmother    COPD Maternal Grandmother        non smoker    Stroke Maternal Grandfather    Colon cancer Neg Hx    Esophageal cancer Neg Hx    Rectal cancer Neg Hx    Stomach cancer Neg Hx       Current Medications[1]  EXAM:  VITALS per patient if applicable:  GENERAL: alert, oriented, appears well and in no acute distress  HEENT: atraumatic, conjunttiva clear, no obvious abnormalities on inspection of external nose and ears  NECK: normal movements of the  head and neck  LUNGS: on inspection no signs of respiratory distress, breathing rate appears normal, no obvious gross SOB, gasping or wheezing  CV: no obvious cyanosis  MS: moves all visible extremities without noticeable abnormality  PSYCH/NEURO: pleasant and cooperative, no obvious depression or anxiety, speech and thought processing grossly intact  ASSESSMENT AND PLAN:  Discussed the following assessment and plan:  1. Weight loss counseling, encounter for (Primary) - He is down about 25 pounds since starting Zepbound .  - Will increase his dose to 15 mg  - Drink plenty of water  - Follow up at CPE or sooner if needed - tirzepatide  (ZEPBOUND ) 15  MG/0.5ML Pen; Inject 15 mg into the skin once a week.  Dispense: 6 mL; Refill: 1  2. Class 1 obesity  - tirzepatide  (ZEPBOUND ) 15 MG/0.5ML Pen; Inject 15 mg into the skin once a week.  Dispense: 6 mL; Refill: 1  3. BMI 35.0-35.9,adult  - tirzepatide  (ZEPBOUND ) 15 MG/0.5ML Pen; Inject 15 mg into the skin once a week.  Dispense: 6 mL; Refill: 1      I discussed the assessment and treatment plan with the patient. The patient was provided an opportunity to ask questions and all were answered. The patient agreed with the plan and demonstrated an understanding of the instructions.   The patient was advised to call back or seek an in-person evaluation if the symptoms worsen or if the condition fails to improve as anticipated.   Darleene Shape, NP      [1]  Current Outpatient Medications:    acetaminophen  (TYLENOL ) 500 MG tablet, Take 1,000 mg by mouth every 6 (six) hours as needed for mild pain (pain score 1-3)., Disp: , Rfl:    aspirin  EC 81 MG tablet, Take 81 mg by mouth daily., Disp: , Rfl:    Cholecalciferol (VITAMIN D ) 2000 units CAPS, Take 2,000 Units by mouth daily., Disp: , Rfl:    Cyanocobalamin  (VITAMIN B-12 PO), Take 1 tablet by mouth daily., Disp: , Rfl:    Multiple Vitamins-Minerals (ZINC PO), Take 1 capsule by mouth daily., Disp: , Rfl:    OVER THE COUNTER MEDICATION, Take 2 tablets by mouth daily. OTC Bariatric Multivitamin post-bariatric surgery, Disp: , Rfl:    Red Yeast Rice 600 MG CAPS, Take 2 capsules by mouth daily., Disp: , Rfl:    vitamin C (ASCORBIC ACID) 500 MG tablet, Take 500 mg by mouth daily., Disp: , Rfl:    voriconazole (VFEND) 200 MG tablet, SMARTSIG:1 Tablet(s) By Mouth Every 12 Hours, Disp: , Rfl:

## 2024-08-30 ENCOUNTER — Encounter: Admitting: Adult Health
